# Patient Record
Sex: Male | Born: 1950 | Race: White | Hispanic: No | Marital: Married | Smoking: Former smoker
Health system: Southern US, Community
[De-identification: ages and names within clinical notes are randomized; demographics above are authoritative.]

## PROBLEM LIST (undated history)

## (undated) DIAGNOSIS — I6529 Occlusion and stenosis of unspecified carotid artery: Secondary | ICD-10-CM

## (undated) DIAGNOSIS — I639 Cerebral infarction, unspecified: Secondary | ICD-10-CM

## (undated) DIAGNOSIS — C801 Malignant (primary) neoplasm, unspecified: Secondary | ICD-10-CM

## (undated) DIAGNOSIS — E785 Hyperlipidemia, unspecified: Secondary | ICD-10-CM

## (undated) DIAGNOSIS — C9590 Leukemia, unspecified not having achieved remission: Secondary | ICD-10-CM

## (undated) DIAGNOSIS — K219 Gastro-esophageal reflux disease without esophagitis: Secondary | ICD-10-CM

## (undated) DIAGNOSIS — I739 Peripheral vascular disease, unspecified: Secondary | ICD-10-CM

## (undated) HISTORY — DX: Cerebral infarction, unspecified: I63.9

## (undated) HISTORY — PX: CHOLECYSTECTOMY: SHX55

## (undated) HISTORY — DX: Occlusion and stenosis of unspecified carotid artery: I65.29

## (undated) HISTORY — PX: PR VEIN BYPASS GRAFT,AORTO-FEM-POP: 35551

---

## 2001-09-16 ENCOUNTER — Ambulatory Visit: Admission: RE | Admit: 2001-09-16 | Discharge: 2001-09-16 | Payer: Self-pay | Admitting: Family Medicine

## 2001-09-19 ENCOUNTER — Encounter: Payer: Self-pay | Admitting: *Deleted

## 2001-09-20 ENCOUNTER — Ambulatory Visit (HOSPITAL_COMMUNITY): Admission: RE | Admit: 2001-09-20 | Discharge: 2001-09-20 | Payer: Self-pay | Admitting: *Deleted

## 2001-09-26 ENCOUNTER — Inpatient Hospital Stay (HOSPITAL_COMMUNITY): Admission: RE | Admit: 2001-09-26 | Discharge: 2001-09-28 | Payer: Self-pay | Admitting: *Deleted

## 2001-09-26 ENCOUNTER — Encounter: Payer: Self-pay | Admitting: *Deleted

## 2002-10-19 ENCOUNTER — Encounter: Payer: Self-pay | Admitting: Vascular Surgery

## 2002-10-19 ENCOUNTER — Inpatient Hospital Stay (HOSPITAL_COMMUNITY): Admission: AD | Admit: 2002-10-19 | Discharge: 2002-10-22 | Payer: Self-pay | Admitting: Vascular Surgery

## 2003-02-16 ENCOUNTER — Encounter: Payer: Self-pay | Admitting: Otolaryngology

## 2003-02-16 ENCOUNTER — Encounter: Admission: RE | Admit: 2003-02-16 | Discharge: 2003-02-16 | Payer: Self-pay | Admitting: Otolaryngology

## 2003-02-23 ENCOUNTER — Encounter (INDEPENDENT_AMBULATORY_CARE_PROVIDER_SITE_OTHER): Payer: Self-pay | Admitting: *Deleted

## 2003-02-23 ENCOUNTER — Ambulatory Visit (HOSPITAL_BASED_OUTPATIENT_CLINIC_OR_DEPARTMENT_OTHER): Admission: RE | Admit: 2003-02-23 | Discharge: 2003-02-23 | Payer: Self-pay | Admitting: Otolaryngology

## 2004-07-23 ENCOUNTER — Encounter: Admission: RE | Admit: 2004-07-23 | Discharge: 2004-07-23 | Payer: Self-pay | Admitting: Otolaryngology

## 2004-07-29 ENCOUNTER — Ambulatory Visit (HOSPITAL_BASED_OUTPATIENT_CLINIC_OR_DEPARTMENT_OTHER): Admission: RE | Admit: 2004-07-29 | Discharge: 2004-07-29 | Payer: Self-pay | Admitting: Otolaryngology

## 2004-07-29 ENCOUNTER — Ambulatory Visit (HOSPITAL_COMMUNITY): Admission: RE | Admit: 2004-07-29 | Discharge: 2004-07-29 | Payer: Self-pay | Admitting: Otolaryngology

## 2004-07-29 ENCOUNTER — Encounter (INDEPENDENT_AMBULATORY_CARE_PROVIDER_SITE_OTHER): Payer: Self-pay | Admitting: *Deleted

## 2004-12-10 ENCOUNTER — Encounter: Admission: RE | Admit: 2004-12-10 | Discharge: 2004-12-10 | Payer: Self-pay | Admitting: Otolaryngology

## 2005-02-13 ENCOUNTER — Ambulatory Visit (HOSPITAL_COMMUNITY): Admission: RE | Admit: 2005-02-13 | Discharge: 2005-02-13 | Payer: Self-pay | Admitting: Otolaryngology

## 2005-02-23 ENCOUNTER — Inpatient Hospital Stay (HOSPITAL_COMMUNITY): Admission: RE | Admit: 2005-02-23 | Discharge: 2005-02-25 | Payer: Self-pay | Admitting: Otolaryngology

## 2005-02-23 ENCOUNTER — Encounter (INDEPENDENT_AMBULATORY_CARE_PROVIDER_SITE_OTHER): Payer: Self-pay | Admitting: *Deleted

## 2005-03-05 ENCOUNTER — Ambulatory Visit: Admission: RE | Admit: 2005-03-05 | Discharge: 2005-06-03 | Payer: Self-pay | Admitting: Radiation Oncology

## 2005-03-11 ENCOUNTER — Ambulatory Visit: Payer: Self-pay | Admitting: Oncology

## 2005-03-17 ENCOUNTER — Ambulatory Visit: Payer: Self-pay | Admitting: Dentistry

## 2005-03-17 ENCOUNTER — Encounter: Admission: RE | Admit: 2005-03-17 | Discharge: 2005-03-17 | Payer: Self-pay | Admitting: Dentistry

## 2005-03-30 ENCOUNTER — Ambulatory Visit (HOSPITAL_COMMUNITY): Admission: RE | Admit: 2005-03-30 | Discharge: 2005-03-30 | Payer: Self-pay | Admitting: Radiation Oncology

## 2005-04-30 ENCOUNTER — Ambulatory Visit: Payer: Self-pay | Admitting: Oncology

## 2005-07-22 ENCOUNTER — Ambulatory Visit: Payer: Self-pay | Admitting: Dentistry

## 2005-08-06 ENCOUNTER — Ambulatory Visit: Payer: Self-pay | Admitting: Oncology

## 2005-08-14 ENCOUNTER — Ambulatory Visit (HOSPITAL_COMMUNITY): Admission: RE | Admit: 2005-08-14 | Discharge: 2005-08-14 | Payer: Self-pay | Admitting: Oncology

## 2005-10-06 ENCOUNTER — Ambulatory Visit: Payer: Self-pay | Admitting: Oncology

## 2005-10-06 LAB — COMPREHENSIVE METABOLIC PANEL
AST: 15 U/L (ref 0–37)
Alkaline Phosphatase: 59 U/L (ref 39–117)
BUN: 12 mg/dL (ref 6–23)
Creatinine, Ser: 1.4 mg/dL (ref 0.4–1.5)

## 2005-10-06 LAB — CBC WITH DIFFERENTIAL/PLATELET
EOS%: 3.4 % (ref 0.0–7.0)
HCT: 40 % (ref 38.7–49.9)
MCH: 31.7 pg (ref 28.0–33.4)
MCV: 93.3 fL (ref 81.6–98.0)
MONO%: 9.1 % (ref 0.0–13.0)
NEUT#: 3.5 10*3/uL (ref 1.5–6.5)
NEUT%: 63 % (ref 40.0–75.0)
Platelets: 207 10*3/uL (ref 145–400)
RBC: 4.29 10*6/uL (ref 4.20–5.71)
lymph#: 1.3 10*3/uL (ref 0.9–3.3)

## 2005-12-30 ENCOUNTER — Ambulatory Visit: Payer: Self-pay | Admitting: Oncology

## 2006-01-05 LAB — COMPREHENSIVE METABOLIC PANEL
ALT: <8 U/L (ref 0–40)
AST: 11 U/L (ref 0–37)
Albumin: 3.9 g/dL (ref 3.5–5.2)
Chloride: 100 mEq/L (ref 96–112)
Sodium: 135 mEq/L (ref 135–145)
Total Protein: 6 g/dL (ref 6.0–8.3)

## 2006-01-05 LAB — CBC WITH DIFFERENTIAL/PLATELET
BASO%: 0.4 % (ref 0.0–2.0)
Basophils Absolute: 0 10*3/uL (ref 0.0–0.1)
HGB: 11.2 g/dL — ABNORMAL LOW (ref 13.0–17.1)
LYMPH%: 12.9 % — ABNORMAL LOW (ref 14.0–48.0)
MONO#: 0.7 10*3/uL (ref 0.1–0.9)
NEUT#: 6.9 10*3/uL — ABNORMAL HIGH (ref 1.5–6.5)
Platelets: 229 10*3/uL (ref 145–400)
RDW: 15.8 % — ABNORMAL HIGH (ref 11.2–14.6)
WBC: 9.1 10*3/uL (ref 4.0–10.0)
lymph#: 1.2 10*3/uL (ref 0.9–3.3)

## 2006-06-07 ENCOUNTER — Ambulatory Visit: Payer: Self-pay | Admitting: Oncology

## 2006-06-09 LAB — COMPREHENSIVE METABOLIC PANEL
AST: 12 U/L (ref 0–37)
Albumin: 4.5 g/dL (ref 3.5–5.2)
BUN: 14 mg/dL (ref 6–23)
Chloride: 100 mEq/L (ref 96–112)
Creatinine, Ser: 1.23 mg/dL (ref 0.40–1.50)
Potassium: 4.8 mEq/L (ref 3.5–5.3)
Sodium: 139 mEq/L (ref 135–145)
Total Bilirubin: 0.6 mg/dL (ref 0.3–1.2)

## 2006-06-09 LAB — CBC WITH DIFFERENTIAL/PLATELET
BASO%: 0.4 % (ref 0.0–2.0)
HCT: 38.1 % — ABNORMAL LOW (ref 38.7–49.9)
HGB: 12.9 g/dL — ABNORMAL LOW (ref 13.0–17.1)
LYMPH%: 15.7 % (ref 14.0–48.0)
MCV: 93.2 fL (ref 81.6–98.0)
Platelets: 265 10*3/uL (ref 145–400)
RDW: 14.1 % (ref 11.2–14.6)

## 2006-07-13 ENCOUNTER — Ambulatory Visit (HOSPITAL_COMMUNITY): Admission: RE | Admit: 2006-07-13 | Discharge: 2006-07-13 | Payer: Self-pay | Admitting: Oncology

## 2006-09-25 ENCOUNTER — Inpatient Hospital Stay (HOSPITAL_COMMUNITY): Admission: EM | Admit: 2006-09-25 | Discharge: 2006-09-26 | Payer: Self-pay | Admitting: Emergency Medicine

## 2006-09-25 ENCOUNTER — Encounter (INDEPENDENT_AMBULATORY_CARE_PROVIDER_SITE_OTHER): Payer: Self-pay | Admitting: *Deleted

## 2006-10-12 ENCOUNTER — Ambulatory Visit: Payer: Self-pay | Admitting: Oncology

## 2006-10-15 LAB — COMPREHENSIVE METABOLIC PANEL
AST: 12 U/L (ref 0–37)
BUN: 11 mg/dL (ref 6–23)
Calcium: 9.9 mg/dL (ref 8.4–10.5)
Chloride: 99 mEq/L (ref 96–112)
Creatinine, Ser: 1.34 mg/dL (ref 0.40–1.50)

## 2006-10-15 LAB — CBC WITH DIFFERENTIAL/PLATELET
Basophils Absolute: 0 10*3/uL (ref 0.0–0.1)
EOS%: 3.5 % (ref 0.0–7.0)
HCT: 36.4 % — ABNORMAL LOW (ref 38.7–49.9)
HGB: 12.6 g/dL — ABNORMAL LOW (ref 13.0–17.1)
MCH: 31.5 pg (ref 28.0–33.4)
MCV: 90.5 fL (ref 81.6–98.0)
MONO%: 9.4 % (ref 0.0–13.0)
NEUT%: 65.6 % (ref 40.0–75.0)

## 2006-10-26 ENCOUNTER — Ambulatory Visit: Payer: Self-pay | Admitting: Vascular Surgery

## 2007-03-04 ENCOUNTER — Ambulatory Visit: Payer: Self-pay | Admitting: Oncology

## 2007-03-10 LAB — CBC WITH DIFFERENTIAL/PLATELET
Basophils Absolute: 0 10*3/uL (ref 0.0–0.1)
EOS%: 3.8 % (ref 0.0–7.0)
Eosinophils Absolute: 0.3 10*3/uL (ref 0.0–0.5)
HCT: 39.3 % (ref 38.7–49.9)
HGB: 13.9 g/dL (ref 13.0–17.1)
MCH: 32.2 pg (ref 28.0–33.4)
MONO#: 0.6 10*3/uL (ref 0.1–0.9)
NEUT#: 6.4 10*3/uL (ref 1.5–6.5)
NEUT%: 71.1 % (ref 40.0–75.0)
lymph#: 1.6 10*3/uL (ref 0.9–3.3)

## 2007-03-10 LAB — COMPREHENSIVE METABOLIC PANEL
Albumin: 4.2 g/dL (ref 3.5–5.2)
BUN: 11 mg/dL (ref 6–23)
CO2: 27 mEq/L (ref 19–32)
Calcium: 9.2 mg/dL (ref 8.4–10.5)
Chloride: 99 mEq/L (ref 96–112)
Creatinine, Ser: 1.26 mg/dL (ref 0.40–1.50)
Glucose, Bld: 111 mg/dL — ABNORMAL HIGH (ref 70–99)

## 2007-04-19 ENCOUNTER — Ambulatory Visit: Payer: Self-pay | Admitting: Vascular Surgery

## 2007-07-07 HISTORY — PX: LYMPHADENECTOMY: SHX5960

## 2007-09-05 ENCOUNTER — Ambulatory Visit: Payer: Self-pay | Admitting: Oncology

## 2007-10-25 ENCOUNTER — Ambulatory Visit: Payer: Self-pay | Admitting: Vascular Surgery

## 2008-04-02 ENCOUNTER — Ambulatory Visit: Payer: Self-pay | Admitting: Oncology

## 2008-04-04 LAB — CBC WITH DIFFERENTIAL/PLATELET
BASO%: 0.5 % (ref 0.0–2.0)
Basophils Absolute: 0 10*3/uL (ref 0.0–0.1)
HCT: 44.3 % (ref 38.7–49.9)
LYMPH%: 20.8 % (ref 14.0–48.0)
MONO%: 7 % (ref 0.0–13.0)
NEUT#: 5.7 10*3/uL (ref 1.5–6.5)
NEUT%: 68.1 % (ref 40.0–75.0)
lymph#: 1.8 10*3/uL (ref 0.9–3.3)

## 2008-04-04 LAB — COMPREHENSIVE METABOLIC PANEL
Albumin: 4.4 g/dL (ref 3.5–5.2)
Alkaline Phosphatase: 89 U/L (ref 39–117)
BUN: 12 mg/dL (ref 6–23)
Chloride: 102 mEq/L (ref 96–112)
Creatinine, Ser: 1.58 mg/dL — ABNORMAL HIGH (ref 0.40–1.50)
Potassium: 3.9 mEq/L (ref 3.5–5.3)
Sodium: 137 mEq/L (ref 135–145)
Total Bilirubin: 0.6 mg/dL (ref 0.3–1.2)
Total Protein: 6.9 g/dL (ref 6.0–8.3)

## 2008-04-10 ENCOUNTER — Ambulatory Visit: Payer: Self-pay | Admitting: Vascular Surgery

## 2008-10-16 ENCOUNTER — Ambulatory Visit: Payer: Self-pay | Admitting: Vascular Surgery

## 2009-04-02 ENCOUNTER — Ambulatory Visit: Payer: Self-pay | Admitting: Oncology

## 2009-04-04 LAB — COMPREHENSIVE METABOLIC PANEL
ALT: 18 U/L (ref 0–53)
AST: 15 U/L (ref 0–37)
BUN: 10 mg/dL (ref 6–23)
CO2: 25 mEq/L (ref 19–32)
Chloride: 102 mEq/L (ref 96–112)
Creatinine, Ser: 1.36 mg/dL (ref 0.40–1.50)
Glucose, Bld: 89 mg/dL (ref 70–99)
Potassium: 4 mEq/L (ref 3.5–5.3)
Total Bilirubin: 0.6 mg/dL (ref 0.3–1.2)
Total Protein: 6.8 g/dL (ref 6.0–8.3)

## 2009-04-04 LAB — CBC WITH DIFFERENTIAL/PLATELET
HCT: 42.8 % (ref 38.4–49.9)
HGB: 14.7 g/dL (ref 13.0–17.1)
MCH: 30.3 pg (ref 27.2–33.4)
MCHC: 34.3 g/dL (ref 32.0–36.0)
MCV: 88.2 fL (ref 79.3–98.0)
MONO#: 0.8 10*3/uL (ref 0.1–0.9)
NEUT#: 5.7 10*3/uL (ref 1.5–6.5)
RBC: 4.85 10*6/uL (ref 4.20–5.82)

## 2009-10-31 ENCOUNTER — Ambulatory Visit: Payer: Self-pay | Admitting: Vascular Surgery

## 2010-07-27 ENCOUNTER — Encounter: Payer: Self-pay | Admitting: Otolaryngology

## 2010-11-18 NOTE — Procedures (Signed)
BYPASS GRAFT EVALUATION   INDICATION:  Follow up evaluation of right femoral to popliteal artery  bypass graft.   HISTORY:  Diabetes:  No  Cardiac:  No  Hypertension:  No  Smoking:  Quit in 2004  Previous Surgery:  Tibial vessel thrombectomy and re-do right fem/pop  bypass graft with nonreversed translocated saphenous vein on 10/19/02 by  Dr. Edilia Bo.   SINGLE LEVEL ARTERIAL EXAM                               RIGHT              LEFT  Brachial:                    118                121  Anterior tibial:             108                40  Posterior tibial:            53  Peroneal:                    89                 40  Ankle/brachial index:        0.89               0.33   PREVIOUS ABI:  Date: 10/26/2006  RIGHT:  0.95  LEFT:  0.41    LOWER EXTREMITY BYPASS GRAFT DUPLEX EXAM:   DUPLEX:  Doppler arterial wave forms are triphasic proximal to and then  distal to the right fem-pop bypass graft.   IMPRESSION:  1. Patent right femoral to popliteal artery bypass graft.  2. Ankle-brachial indexes are slightly lower than previously recorded      bilaterally.   ___________________________________________  Di Kindle. Edilia Bo, M.D.   MC/MEDQ  D:  04/19/2007  T:  04/20/2007  Job:  [id number]

## 2010-11-18 NOTE — Procedures (Signed)
BYPASS GRAFT EVALUATION   INDICATION:  Follow-up of lower extremity bypass graft.   HISTORY:  Diabetes:  No.  Cardiac:  No.  Hypertension:  Yes.  Smoking:  No.  Previous Surgery:  Tibial vessel thrombectomy and redo of right fem-pop  bypass graft with nonreversed translocated saphenous vein on 10/19/2002  by Dr. Edilia Bo.   SINGLE LEVEL ARTERIAL EXAM                               RIGHT              LEFT  Brachial:                    164                164  Anterior tibial:             136                858  Posterior tibial:            151  Peroneal:                                       65  Ankle/brachial index:        0.92               0.40   PREVIOUS ABI:  Date: 04/10/2008  RIGHT:  1.07  LEFT:  0.44   LOWER EXTREMITY BYPASS GRAFT DUPLEX EXAM:   DUPLEX:  1. Patent right fem-pop bypass graft with no evidence of focal      stenosis.  2. Biphasic duplex waveforms noted within the graft and native      arteries with increased velocity of 295 cm/s at the inflow artery.   IMPRESSION:  1. Right ABI suggests mild arterial disease.  2. Left ABI suggests severe arterial disease.   ___________________________________________  Di Kindle. Edilia Bo, M.D.   AC/MEDQ  D:  10/16/2008  T:  10/16/2008  Job:  [id number]

## 2010-11-18 NOTE — Procedures (Signed)
BYPASS GRAFT EVALUATION   INDICATION:  Follow up right fem-pop bypass graft.   HISTORY:  Diabetes:  No.  Cardiac:  No.  Hypertension:  No.  Smoking:  No.  Previous Surgery:  Please see above.   SINGLE LEVEL ARTERIAL EXAM                               RIGHT              LEFT  Brachial:                    147                143  Anterior tibial:             130                43  Posterior tibial:            157                65  Peroneal:  Ankle/brachial index:        1.07               0.44   PREVIOUS ABI:  Date: 10/26/07  RIGHT:  0.95  LEFT:  0.34   LOWER EXTREMITY BYPASS GRAFT DUPLEX EXAM:   DUPLEX:  Patent right fem-pop bypass with no evidence of focal stenosis.   IMPRESSION:  1. Patent right femoropopliteal bypass graft with no evidence of focal      stenosis.  2. Normal ankle brachial index with biphasic Doppler waveform noted in      the right leg, status post right femoropopliteal bypass graft.  3. Moderately abnormal ankle brachial index with monophasic Doppler      waveform noted in the left leg.   ___________________________________________  Di Kindle. Edilia Bo, M.D.   MG/MEDQ  D:  04/10/2008  T:  04/10/2008  Job:  [id number]

## 2010-11-18 NOTE — Procedures (Signed)
BYPASS GRAFT EVALUATION   INDICATION:  Follow up right lower extremity bypass graft, stable left  lower extremity claudication.   HISTORY:  Diabetes:  No.  Cardiac:  No.  Hypertension:  Yes.  Smoking:  Previous.  Previous Surgery:  Re-do right femoral-popliteal artery bypass graft and  tibial vessel thrombectomy, 10/19/2002 by Dr. Edilia Bo.   SINGLE LEVEL ARTERIAL EXAM                               RIGHT              LEFT  Brachial:                    116                124  Anterior tibial:             118                57  Posterior tibial:            129                58  Peroneal:  Ankle/brachial index:        1.04               0.47   PREVIOUS ABI:  Date: 10/16/2008  RIGHT:  0.92  LEFT:  0.40   LOWER EXTREMITY BYPASS GRAFT DUPLEX EXAM:   DUPLEX:  Doppler arterial waveforms appear biphasic proximal to, within,  and distal to the right lower extremity bypass graft.   IMPRESSION:  1. Patent right femoral-popliteal artery bypass graft.  2. Bilateral ankle brachial indices appear stable from previous      studies.  3. Right ankle brachial indices within normal limits.  4. Left ankle brachial index is suggestive of moderate/severe arterial      compromise.  5. No significant changes from previous study.   ___________________________________________  Di Kindle. Edilia Bo, M.D.   AS/MEDQ  D:  10/31/2009  T:  10/31/2009  Job:  [id number]

## 2010-11-21 NOTE — Op Note (Signed)
[place]. [facility]  Patient:    [name], [name] Visit Number: [id number] MRN: [id number]          Service Type: SUR Location: 2000 2018 01 Attending Physician:  Caralee Ates Dictated by:   Caralee Ates, M.D. Proc. Date: 09/26/01 Admit Date:  09/26/2001                             Operative Report  PREOPERATIVE DIAGNOSIS:  Right lower extremity ischemia.  POSTOPERATIVE DIAGNOSIS:  Right lower extremity ischemia.  OPERATION PERFORMED:  Right femoral to above-knee artery popliteal bypass (8 mm PTFE Inter-ring).  SURGEON:  Caralee Ates, M.D.  ASSISTANT:  Adair Patter, P.A.  ANESTHESIA:  General endotracheal.  ESTIMATED BLOOD LOSS:  50 cc.  DRAINS:  None.  SPECIMENS:  None.  COMPLICATIONS:  None.  INDICATIONS FOR PROCEDURE:  The patient is a 60 year old white male who has had progressive debilitating pain in both lower extremities for approximately six months.  His current pain-free interval is limited to just a few steps and he is unable to participate in his job activities.  He underwent arteriogram which demonstrated superficial femoral artery occlusion with two-vessel runoff on the right via peroneal and anterior tibial artery.  He was felt to be a good candidate for reconstruction with above-knee femoropopliteal artery.  DESCRIPTION OF PROCEDURE:  The patient was brought to the operating room and placed on the operating table in supine position.  Following adequate general endotracheal anesthesia, the right lower extremity was prepped and draped circumferentially from the groin to the foot.  A vertical incision was made in the right groin and deepened using the Bovie to control bleeding. The common femoral artery was identified at this level and encircled proximally with a vessel loop.  The distal common femoral artery was exposed down to its bifurcation and the profunda femoris and superficial femoral arteries were also encircled with  vessel loops individually.  At this point the wound was then packed with thrombin saline soaked gauze sponge.  Next, a longitudinal incision was made over the medial aspect of the right thigh just above the knee.  The wound was deepened using the Bovie to control bleeding.  The sartorius muscle was reflected inferiorly and the popliteal space was entered above the knee.  The superficial femoral artery at its junction with the popliteal artery was identified at this level and encircled proximally and distally with vessel loops.  Next an 8 mm Inter-ring PTFE graft was then tunneled in the anatomic plane deep to the sartorius muscle and brought out in both wounds.  The patient was then systemically heparinized.  Following an adequate three-minute circulation time, the common femoral, superficial femoral and profunda femoris were occluded.  A longitudinal arteriotomy was performed.  The PTFE graft was then spatulated and sewn into position in end-to-side fashion with running 6-0 PTFE suture.  Following completion of anastomosis, a clamp was placed on the graft and the loops around the superficial femoral, profunda and common femoral artery were released.  There was no bleeding from the anastomotic suture line.  Next, the loops around the popliteal artery were tightened to occlude flow.  A longitudinal arteriotomy was performed.  The PTFE graft was extended to the appropriate length, spatulated and sewn into position with running 6-0 PTFE suture.  Prior to completion anastomosis, the native vessels were flushed and the graft was flushed as well.  The anastomosis was  then completed and the clamps were released restoring flow.  There was excellent flow in the anterior tibial and peroneal arteries which was not easily heard preoperatively.  This flow was graft dependent.  At completion, arteriogram was performed which demonstrated a good flow in the popliteal and peroneal and anterior tibial  arteries.  Next, 30 mg of protamine was administered and hemostasis was achieved in the wounds. The wounds were then irrigated with warm sterile saline and closed in layers of 2, 3, and 4-0 Vicryl suture.  Sterile dry dressings were applied and the patient was then awakened from anesthesia and transferred to the recovery room in stable condition.  The patient tolerated the procedure well.  There were no complications.  All sponge and needle counts were reported as correct.Dictated y:   Caralee Ates, M.D. Attending Physician:  Caralee Ates. DD:  09/26/01 TD:  09/27/01 Job: [id number]

## 2010-11-21 NOTE — Op Note (Signed)
NAME:  [name] [name], [name] [name] NO.:  [id number]   MEDICAL RECORD NO.:  [id number]                   PATIENT TYPE:  OIB   LOCATION:  2871                                 FACILITY:  [facility]   PHYSICIAN:  Di Kindle. Edilia Bo, M.D.        DATE OF BIRTH:  [birth year]   DATE OF PROCEDURE:  10/19/2002  DATE OF DISCHARGE:                                 OPERATIVE REPORT   PREOPERATIVE DIAGNOSIS:  Ischemic right lower extremity with occluded right  femoral-popliteal bypass graft.   POSTOPERATIVE DIAGNOSIS:  Ischemic right lower extremity with occluded  femoral-popliteal bypass graft.   PROCEDURES:  1. Redo right femoral to above-knee popliteal artery bypass graft with a     nonreversed, translocated saphenous vein graft.  2. Tibial thrombectomy.  3. Intraoperative arteriogram.   SURGEON:  Di Kindle. Edilia Bo, M.D.   ASSISTANTS:  Toribio Harbour, R.N., and Loura Pardon, P.A.   ANESTHESIA:  General.   INDICATIONS:  This is a 60 year old gentleman who had undergone a previous  right femoral to above-knee popliteal artery bypass graft with an 8 mm PTFE  graft by Caralee Ates, M.D., in March 2003.  He had been doing well since  then with no significant claudication in the right lower extremity until  approximately 10:30 reportedly this morning, when he developed the acute  onset of severe, disabling pain in the right lower extremity.  He was seen  in our office and found to have no Doppler flow in the right foot with a  pale, ischemic, cold right foot.  He had decreased sensation and was taken  to the hospital urgently for revascularization.  On examination the patient  had no Doppler flow in the foot with a cool, pale right foot.  There was  decreased motor and sensory function.   DESCRIPTION OF PROCEDURE:  The patient was taken to the operating room and  received a general anesthetic.  The entire right lower extremity was prepped  and draped in the usual  sterile fashion.  The incision in the right groin  was opened into dense scar tissue.  The common femoral, superficial femoral,  and deep femoral artery, and the femoral-popliteal bypass graft were  dissected free and controlled with vessel loops.  There was a reasonable  pulse at the femoral level with some plaque in the posterior and medial wall  of the common femoral artery.  Next as this graft had only been in for a  year, I felt that it might be worth redoing the bypass with a vein graft if  the vein was of adequate size.  I therefore explored the saphenofemoral  junction and followed the saphenous vein down to the knee using three  additional incisions along the medial aspect of the right lower extremity.  The vein was felt to be of adequate size and branches were divided between  clips and 3-0 silk ties.  Through  the incision in the distal thigh which had  previously been used for the exposure, the above-knee popliteal artery and  the distal PTFE graft were exposed.  The above-knee popliteal artery did  have some plaque in it but was patent.  Next a tunnel was created between  the above-knee popliteal artery incision and the groin incision.  The  saphenofemoral junction was then clamped and then the saphenous vein removed  from the femoral vein, and the femoral vein was oversewn with a 5-0 Prolene  suture.  The proximal valve in the vein was sharply excised.  The vein was  then spatulated to be used in a nonreversed fashion.  It had been distended  up with heparinized saline.  Next the common femoral, superficial femoral,  and deep femoral arteries were controlled after the patient was heparinized.  The old graft was divided and the entire proximal segment of the graft was  removed from the femoral artery.  I extended this arteriotomy further  proximally and then the widely spatulated saphenous vein was sewn end-to-  side to the common femoral artery using continuous 6-0 Prolene  suture.  Prior to completing the closure, the vessels were backbled and flushed  appropriately and the anastomosis completed.  Next the valves in the vein  were lysed using a retrograde Mills valvulotome and excellent flow  established to the graft.  The graft was then flushed with heparinized  saline and clamped.  It was then marked to prevent twisting.  It was then  brought through the previously-created tunnel.  Next the distal graft was  divided, and this entire distal segment of the graft was removed from the  above-knee popliteal artery.  There was significant clot from the popliteal  artery, and that extended down into the tibial vessels.  Therefore, I did a  tibial thrombectomy using a #3 and #4 Fogarty catheter and did retrieve a  large amount of clot.  The patient most likely had occluded that femoral-  popliteal graft and then embolized to the tibial vessels.  This explained  the profound ischemia on presentation.  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  Next the vein graft was cut to the appropriate  length, spatulated, and sewn end-to-side to the popliteal artery.  After  this the arteriotomy had been extended significantly lower on the popliteal  artery above the knee.  Prior to completing the anastomosis, the vessels  were backbled and flushed appropriately and the anastomosis completed.  Flow  was re-established to the right leg and then the foot did pink up.  Intraoperative arteriogram was obtained, which showed no technical problems  and two-vessel runoff via the anterior tibial and peroneal arteries, which  had moderate diffuse disease.  There was an anterior tibial and peroneal  signal at the completion of the procedure.  Hemostasis was obtained in the wounds.  Two 15 Blake drains were placed.  The wounds  were closed with a  deep layer of 3-0 Vicryl and the skin closed with staples.  A sterile  dressing was applied.  The patient tolerated the procedure well and was  transferred to the recovery room in satisfactory condition.  All needle and  sponge counts were correct.  Di Kindle. Edilia Bo, M.D.    CSD/MEDQ  D:  10/19/2002  T:  10/20/2002  Job:  [id number]

## 2010-11-21 NOTE — Consult Note (Signed)
[place]. [facility]  Patient:    [name] [name], [name] [name] Visit Number: [id number] MRN: [id number]          Service Type: DSU Location: 2000 2019 01 Attending Physician:  Caralee Ates Dictated by:   Caralee Ates, M.D. Proc. Date: 09/20/01 Admit Date:  09/20/2001 Discharge Date: 09/20/2001   CC:         CVTS Office   Consultation Report  PREOPERATIVE DIAGNOSIS:  Bilateral lower extremity ischemia.  POSTOPERATIVE DIAGNOSIS:  Bilateral lower extremities ischemia.  PROCEDURES: 1. Aortogram with bilateral lower extremity runoff. 2. Selective right lower extremity angiogram. 3. Selective left lower extremity angiogram (2nd order).  SURGEON:  Caralee Ates, M.D.  ACCESS:  Right common femoral artery (#5-French sheath).  TOTAL CONTRAST:  200 cc of Visipaque.  TOTAL FLUOROSCOPY TIME:  6 minutes, 10 seconds.  COMPLICATIONS:  None.  BRIEF HISTORY:  This is a 60 year old white gentleman with a long history of heavy cigarette smoking who has presented to his primary care physician with complaints of bilateral lower extremity calf and foot pain with very short distance claudication (less than 1 block) as well as some superficial skin necrosis on the lateral aspects of both feet. She was found to have extremely poor Doppler flow in both feet with ankle brachial indices that could not be calculated.  He was felt to need peripheral reconstruction for limb salvage due to the fact that they already had early tissue loss in both feet. He was scheduled for arteriogram in preparation for revascularization.  DESCRIPTION OF PROCEDURE:  The patient was brought to the cath lab and placed on the cath lab table in supine position. Following adequate IV sedation, the groins were prepped and draped in a sterile fashion. The right femoral head was localized under fluoroscopy and the overlying skin was anesthetized with 1% lidocaine. A small nick incision was made at this level,  and a tract was developed bluntly down to the level of the right common femoral artery. The right common femoral artery was percutaneously punctured. A .035 guidewire was advanced into the infrarenal aorta without difficulty. A #5-French pigtail cath was advanced over the guidewire in position in the infrarenal aorta and aortogram was performed. Once satisfactory images had been obtained, the runoff images of both lower extremities were performed. Peek hole images of the right leg below the level of the knee and the lateral view of the foot were performed. Next, the IMA catheter was used to cannulate the left iliac artery across the bifurcation of the aorta, and the guidewire was advanced on through the left iliac system. An end-hole catheter was then exchanged for the IMA catheter and advanced into the external iliac. The selective images of the left lower extremity below the level of the knee were performed in multiple oblique positions to clearly delineate the anatomy. Once satisfactory images were obtained, the catheter was then removed over guidewire. With completion of the procedure, the sheath in the right common femoral artery was then removed and direct pressure was held until hemostasis was achieved. The patient was then transferred to the recovery area in stable condition. The patient tolerated the procedure well.  ANGIOGRAPHIC FINDINGS:  Normal caliber in infrarenal aorta. There is a single right renal artery, impaired left renal arteries, all of which appear to be normal in caliber without evidence of stenosis. The common iliac arteries   on both sides are widely patent. There is only minimal atherosclerotic change  in the right  common iliac artery. The external iliac arteries are widely patent. Common femoral arteries are both patent bilaterally. On the right side, the superficial femoral artery is secluded at its origin. The profundus femoris artery is patent with  well-developed branches into the thigh. The superficial femoral artery reconstitutes at the proximal thigh and is patent throughout the remainder of its course. There is a high-grade (greater than 60% stenosis) of the distal SFA, proximal to Hunters canal. Distal to this, the SFA and the popliteal artery are widely patent. There is 2-vessel runoff to the foot via the anterior tibial and peroneal artery. On the left side, the superficial artery is occluded at its origin and reconstitutes through a profunda collateral and then occludes for a long segment in the mid thigh. The profundus femoris artery is widely patent with well-developed branches into the thigh. The SFA reconstitutes in the distal and is patent throughout the remainder of its course. The popliteal artery is widely patent, however, there is an area of approximately 40% stenosis just about the knee. The remainder of the popliteal is widely patent. The runoff to the foot is via the anterior tibial and peroneal artery on this side, however, there is a proximal stenosis at the origin of the anterior tibial artery and the peroneal artery appears to branch in multiple small collaterals just about the ankle. The anterior tibial artery appears to be patent onto the foot, however, the circulation time is slow that images into the foot could not be  obtained.  RECOMMENDATIONS: 1. Routine post-catheterization care. 2. Will recommend femoral above knee artery popliteal bypass to    the right lower extremity and femoral anterior tibial artery    bypass to the left lower extremity, both of which were limb salvage.    Will obtain preoperative vein mapping and cardiac risk stratification    in preparation for revascularization. Dictated by:   Caralee Ates, M.D. Attending Physician:  Caralee Ates. DD:  09/20/01 TD:  09/21/01 Job: [id number]

## 2010-11-21 NOTE — Procedures (Signed)
BYPASS GRAFT EVALUATION   INDICATION:  Followup, right lower extremity bypass graft.  Patient with  no new complaints since last visit on 04/19/07.   HISTORY:  Diabetes:  No.  Cardiac:  No.  Hypertension:  No, just taken off medication.  Smoking:  No.  Previous Surgery:  Tibial vessel thrombectomy and redo of right fem-pop  BPG with nonreversed translocated saphenous vein on 10/19/02 by Dr.  Edilia Bo.   SINGLE LEVEL ARTERIAL EXAM                               RIGHT              LEFT  Brachial:                    130                130  Anterior tibial:             120                44  Posterior tibial:            123  Peroneal:  Ankle/brachial index:        0.95               0.34   PREVIOUS ABI:  Date: 04/19/07  RIGHT:  0.89  LEFT:  0.33   LOWER EXTREMITY BYPASS GRAFT DUPLEX EXAM:   DUPLEX:  1. Patent right CFA inflow with increased velocity noted.  2. Patent right fem-pop BPG with velocities ranging from 66 cm/s - 101      cm/s without evidence of stenosis.  3. Patent distal native artery.   IMPRESSION:  1. Mild increase in right ankle brachial index.  2. Stable left ankle brachial index.  3. Patent right common femoral artery inflow with increased velocity      noted.  4. Patent right femoral-popliteal bypass graft without evidence of      stenosis.  5. Patent distal native artery.   ___________________________________________  Di Kindle. Edilia Bo, M.D.   PB/MEDQ  D:  10/26/2007  T:  10/26/2007  Job:  [id number]

## 2010-11-21 NOTE — Op Note (Signed)
NAME:  THIERNO, HUN NO.:  [id number]   MEDICAL RECORD NO.:  [id number]          PATIENT TYPE:  INP   LOCATION:  1612                         FACILITY:  [facility]   PHYSICIAN:  Sandria Bales. Ezzard Standing, M.D.  DATE OF BIRTH:  Nov 21, 1950   DATE OF PROCEDURE:  09/25/2006  DATE OF DISCHARGE:                               OPERATIVE REPORT   PREOPERATIVE DIAGNOSIS:  Acute cholecystitis with cholelithiasis.   POSTOPERATIVE DIAGNOSIS:  Acute and chronic cholecystitis with  cholelithiasis and a ruptured gallbladder.   PROCEDURE:  Laparoscopic cholecystectomy with intraoperative  cholangiogram.   SURGEON:  Sandria Bales. Ezzard Standing, M.D.   ASSISTANT:  Angelia Mould. Derrell Lolling, M.D.   ANESTHESIA:  General endotracheal anesthesia.   ESTIMATED BLOOD LOSS:  75 mL.   DRAINS:  19 Blake drain.   INDICATIONS FOR PROCEDURE:  Mr. Knotts is a 60 year old white male who has  had about a four day history of abdominal pain, was admitted last  night/early this morning with a CT scan and ultrasound showing evidence  of acute cholecystitis and a white blood count over 20,000.  I discussed with him the indications and potential complications of  gallbladder surgery, the potential complications include but are not  limited to bleeding, infection, bile duct injury, and the possibility of  open surgery.   OPERATIVE NOTE:  The patient was placed in a supine position and given a  general endotracheal anesthesia.  He was on Ancef as an antibiotic.  He  had PAS stockings in place.  A time out was held identifying the patient  and procedure.  The abdomen was prepped with Betadine solution and  sterilely draped.  An infraumbilical incision was made with sharp  dissection and carried down to the abdominal cavity.  A 0 degree 10 mm  laparoscope was inserted through a 12 mm Hassan trocar and the [facility]  trocar was secured with a 0 Vicryl suture.  Four additional trocars were  placed.  There was a 10 mm subxiphoid  location, a 5 mm right mid  subcostal, a 5 mm lateral subcostal, and a 5 mm midline as a fifth  trocar.  The gallbladder was noted to be acutely inflamed, there was  bile spillage and locally, the gallbladder ruptured along the medial  wall.  Dissection was carried down, dissecting omentum off the  gallbladder.  I got down to where I identified as a cystic duct  gallbladder junction.  I then shot an intraoperative cholangiogram.   The intraoperative cholangiogram was shot using a cut off taut catheter  inserted through a 14 gauge Jelco catheter, inserted into the side of  the cut cystic duct and secured with an endoclip.  Half strength Hypaque  solution was injected showing free flow of contrast down the cystic duct  into the duodenum and up the hepatic radicals.  There was no bile leak  and it looked like he actually had a little bit longer cystic duct than  what I imagined at the time of surgery.  What had happened where this  gallbladder wall ruptured medially was right over  the portal triad and  so after I was able to free up the cystic duct, I was able to identify  or expose this better.   So, I removed my taut catheter.  I triply endoclipped the cystic duct.  I then developed the triangle of Calot, again which was distorted by  this medial rupture of the gallbladder.  The gallbladder was then  sharply and bluntly dissected from the gallbladder bed.  I did spill  stones which I retrieved as I found them.  This medial part of the wall  which was ruptured, basically there was no way to get this wall off, so  I left part of the wall along the back of the gallbladder.  After  excising the entire gallbladder, I placed it in an endocatch bag, I  burned the back of the wall for any remaining gallbladder tissue or  lining, also controlling bleeding.  After irrigating with about 3.5  liters of saline, I reinspected the liver wall and the triangle of  Calot.  There was no bleeding or bile  leak.  The gallbladder was then  placed in an endocatch bag.  I brought a 52 [place] Blake drain through  the lateral lower trocar and sewed this in place with a 2-0 nylon  suture.   The gallbladder was delivered.  The abdomen was inspected, again, there  was no bleeding and no bile leak.  Each trocar was removed in turn.  The  umbilical port was closed with two sutures of 2-0 Vicryl suture, the  skin was closed with a 5-0 Vicryl suture, painted with tincture of  Benzoin, and covered with Steri-Strips.   The patient tolerated the procedure well and was transported to the  recovery room in good condition.  Sponge and needle counts were correct  at the end of the case.      Sandria Bales. Ezzard Standing, M.D.  Electronically Signed     DHN/MEDQ  D:  09/25/2006  T:  09/25/2006  Job:  [id number]   cc:   Holley Bouche, M.D.  Fax: [phone]   Kristine Garbe. Ezzard Standing, M.D.  Fax: [phone]   Di Kindle. Edilia Bo, M.D.  [address]  [place]  [place] [address]

## 2010-11-21 NOTE — H&P (Signed)
NAME:  [name] [name], [name] [name] NO.:  [id number]   MEDICAL RECORD NO.:  [id number]                   PATIENT TYPE:  OIB   LOCATION:  2871                                 FACILITY:  [facility]   PHYSICIAN:  Di Kindle. Edilia [name], M.D.        DATE OF BIRTH:  17-Jul-1950   DATE OF ADMISSION:  10/19/2002  DATE OF DISCHARGE:                                HISTORY & PHYSICAL   PRIMARY CARE PHYSICIAN:  Holley Bouche, M.D.   CHIEF COMPLAINT:  Pain, right foot.   HISTORY OF PRESENT ILLNESS:  The patient is a 60 year old Caucasian man who  presented to the CVTS office today with a complaint of sudden onset of pain  in his right calf and foot at approximately 10:30 a.m.  He is status post a  right femoral above the knee popliteal bypass by Caralee Ates, M.D., in  March of 2003.  Follow-up lower extremity duplex exam on September 29, 2002,  revealed a stable bypass graft with ABIs noted to be 0.85 on the right and  0.36 on the left.  Today's lower duplex exam revealed a failing right  femoropopliteal bypass graft.  ABI on the right was inaudible and on the  left it was 0.32.  Dr. Di Kindle. Dickson's recommendation was to  undergo thrombectomy and possible revision of his femoropopliteal bypass  graft today.   PAST MEDICAL HISTORY:  1. Peripheral vascular occlusive disease.  He had an angiogram on September 20, 2001, which revealed bilateral femoropopliteal occlusive disease.  He is     status post a right femoral to above the knee popliteal bypass with PTFE     graft on September 26, 2001, by Caralee Ates, M.D.  2. Hypertension.   He specifically denies any history of heart disease, diabetes, and kidney  disease.   ALLERGIES:  The patient has no known drug allergies.   MEDICATIONS PRIOR TO ADMISSION:  1. Bisoprolol/HCTZ 2.5/6.25 mg daily.  2. Aspirin 81 mg p.o. daily.   SOCIAL HISTORY:  He is married.  He and his wife have four adult children  together.  He is  employed as an Personnel officer.  He continues to use tobacco at  a rate of one pack a day and he has done this for the last 40-41 years.  He  has stopped drinking alcohol since last March.  He lives with his wife in a  single-level dwelling.  He drives, as does his wife, and she will be  available for assistance after discharge from the hospital.   FAMILY HISTORY:  Noncontributory.   REVIEW OF SYSTEMS:  In general, [name] [name] ______ is very good.  He reports no  difficulty chewing or swallowing.  No changes in his vision or hearing of  late.  He does report some dyspnea on exertion, particularly after walking  stairs.  He does have a mild cough.  He  has no chest pain.  No GI  complaints.  No GU complaints.  He ambulates independently, however, he does  have claudication, left worse than the right.  This occurs within one city  block of walking.   PHYSICAL EXAMINATION:  VITAL SIGNS:  Blood pressure 155/104, heart rate 78,  temperature 97.1 degrees.  HEIGHT:  5 feet 6 inches.  WEIGHT:  176 pounds.  GENERAL APPEARANCE:  This is a 60 year old Caucasian man in no acute  distress, lying on a hospital stretcher in preparation for surgery.  HEENT:  His oral mucosa is pink and moist.  He is edentulous.  NECK:  Full range of motion.  No carotid bruits are appreciated.  No  thyromegaly or lymphadenopathy is noted.  LUNGS:  His breathing is unlabored.  His breath sounds on the left are with  rhonchi and clear on the right.  HEART:  Regular rate and rhythm.  ABDOMEN:  Rotund.  He does have positive bowel sounds.  His abdomen is soft  and nontender.  EXTREMITIES:  The right lower leg and foot are cool.  His toes are white.  He is able to move and feel sensation.  His left foot is pink and warm.  He  has 1+ femoral pulses bilaterally.  His popliteal and pedal pulses are not  palpable bilaterally.  NEUROLOGIC:  He is alert and oriented.  Cranial nerves II-XII are grossly  intact.  His gait was not  assessed.   LABORATORY DATA:  The CBC revealed white blood cell count 13.2, hemoglobin  15.5, hematocrit 45.2, and platelets 220.  Chemistries include sodium 133,  potassium 3.8, chloride 101, CO2 25, BUN 6, creatinine 0.8, and glucose 99.  PT 13.1, INR 0.9.  The admission EKG is in normal sinus rhythm at 72 beats  per minute.   IMPRESSION:  This is a 60 year old Caucasian man with acute failure of his  right above the knee femoropopliteal bypass graft.   PLAN:  The plan per Di Kindle. Edilia [name], M.D., is to admit to [facility].  [facility] under the care of Dr. Edilia [name] for urgent thrombectomy  and possible revision of his right above the knee popliteal bypass graft.  He will also be counseled again on smoking cessation during this  hospitalization.     Toribio Harbour, R.N.                  Di Kindle. Edilia [name], M.D.    CTK/MEDQ  D:  10/19/2002  T:  10/19/2002  Job:  [id number]

## 2010-11-21 NOTE — Op Note (Signed)
[place]  Patient:    [name] [name], [name] [name] Visit Number: [id number] MRN: [id number]          Service Type: SUR Location: [id number] Attending Physician:  Caralee Ates. Dictated by:   [facility].[facility] Proc. Date: 09/26/01 Admit Date:  09/26/2001                             Operative Report  NO DICTATION Dictated by:   [id number] Attending Physician:  Caralee Ates. DD:  09/26/01 TD:  09/27/01 Job: [id number]

## 2010-11-21 NOTE — Op Note (Signed)
NAME:  [name] [name], [name] [name] NO.:  [id number]   MEDICAL RECORD NO.:  [id number]          PATIENT TYPE:  INP   LOCATION:  2550                         FACILITY:  [facility]   PHYSICIAN:  Kristine Garbe. 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   OPERATION PERFORMED:  Direct laryngoscopy.  Right radical neck dissection.   SURGEON:  Kristine Garbe. Ezzard Standing, M.D.   ANESTHESIA:  General endotracheal.   COMPLICATIONS:  None.   ASSISTANT:  Hermelinda Medicus, M.D.   INDICATIONS FOR PROCEDURE:  [name] [name] is a 60 year old gentleman who is  seven months status post excision of a T1 right palatal squamous cell  carcinoma in January.  He had a follow-up CT scan in June which was negative  for any adenopathy in the neck.  However, on follow-up exam in August, he  developed a nodule in the right upper neck and PET scan was consistent with  probable carcinoma right superior jugular neck node.  He was taken to the  operating room time for a right radical neck dissection and direct  laryngoscopy.   DESCRIPTION OF PROCEDURE:  After adequate endotracheal anesthesia, the  patient received 1 g Ancef IV preoperatively.  First direct laryngoscopy was  performed.  The area of excision of the palatal carcinoma is well healed. It  was soft to palpation with no induration and no mucosal abnormalities noted.  Base of tongue and vallecula were clear. Epiglottis was normal.  Vocal cords  were clear.  Piriform sinuses were clear.  This completed direct  laryngoscopy.  Next, the patient was turned.  Right neck was prepped with  Betadine solution and draped out with sterile towels.  The patient had a  palpable node in the superior  jugular node.  A standard neck incision was  made from the mastoid tip down toward the laryngeal cartilage with a  vertical limb down over the supraclavicular area.  The subplatysmal flaps  were elevated superiorly and inferiorly.  First the 11th cranial nerve or  accessory nerve was identified just below the sternocleidomastoid muscle  posteriorly and this was preserved throughout the dissection. Next,  dissection was carried out inferiorly where the supraclavicular fat pad and  lymph nodes were identified.  There were some anthracotic or dark nodes in  the supraclavicular area that were removed with the neck specimen.  The  jugular vein was identified, was ligated with 2-0 silk suture and 2-0 silk  suture ligatures and divided.  The vagus nerve and phrenic nerve were  identified and preserved.  The carotid artery was identified and dissection  was carried up along the carotid artery.  In the submandibular area, the  submandibular gland was removed and excised and sent with the neck  dissection but as a separate specimen.  There were no palpable submandibular  nodes.  Dissection was carried along the carotid artery superiorly.  At the  superior aspect of the carotid artery, the 12th cranial nerve was identified  and preserved and the 11th nerve at it ran up next to the jugular vein was  identified and preserved.  The jugular vein was then dissected out  superiorly and ligated with 2-0 silk suture ligature and divided.  The large  nodes were in the superior portion of the jugular chain but on dissection,  the nodes were not attached to any of the neurovascular bundle and came up  easily off of the carotid artery.  We were well above the superior aspect of  the enlarged nodes when we divided the jugular vein superiorly.  The  remaining fascia and lymphatics were dissected off of the deep cervical  fascia and the neck dissection was removed and sent as a specimen with a  single large long  silk suture identifying the superior aspect of the neck  dissection.  Hemostasis was obtained with 2-0 silk ligatures along with a  couple of 4-0 silk ligatures.  Along with cautery.  The neck was irrigated  with saline.  Of note there were some chyle leak inferiorly at the inferior  aspect of the dissection of the jugular vein and several 4-0 silk ligatures  were placed in this area where the chyle leak appeared to be extruding.  After obtaining control of hemostasis and chyle leak, the wound was  irrigated again with saline.  Specimen was sent off and the neck dissection  was closed with 3-0 chromic sutures subcutaneously to reapproximate the  platysma muscle, staples on the skin.  A large Hemovac drain was brought out  through a separate stab incision inferiorly and secured to the skin with 2-0  silk ligature.  This completed the procedure.  [name] [name] was awakened from  anesthesia and transferred to the recovery room doing well.   DISPOSITION:  [name] [name] will be observed, will be admitted to the hospital for  two to three days for recovery .           ______________________________  Kristine Garbe Ezzard Standing, M.D.     CEN/MEDQ  D:  02/23/2005  T:  02/23/2005  Job:  [id number]   cc:   Holley Bouche, M.D.  [address]  [place], [place] [address]  Fax: [phone]   Hermelinda Medicus, M.D.  [address]  [place] [address]  Fax: [phone]

## 2010-11-21 NOTE — Op Note (Signed)
NAME:  [name] [name], NANNINI NO.:  [id number]   MEDICAL RECORD NO.:  [id number]          PATIENT TYPE:  AMB   LOCATION:  DSC                          FACILITY:  [facility]   PHYSICIAN:  Christopher E. Ezzard Standing, M.D.DATE OF BIRTH:  Mar 31, 1951   DATE OF PROCEDURE:  07/29/2004  DATE OF DISCHARGE:                                 OPERATIVE REPORT   PREOPERATIVE DIAGNOSES:  1.  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  2.  Right anterior vocal cord nodule lesion, questionable neoplasia.   POSTOPERATIVE DIAGNOSES:  1.  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  2.  Right anterior vocal cord nodule lesion, questionable neoplasia.   OPERATION:  1.  Wide excision of right palate, tonsil squamous cell carcinoma.  2.  Microlaryngoscopy with excision of right anterior vocal cord nodule.   SURGEON:  Kristine Garbe. Ezzard Standing, M.D.   ANESTHESIA:  General endotracheal.   COMPLICATIONS:  None.   BRIEF CLINICAL NOTE:  [name] [name] is a 60 year old gentleman who has had a  sore throat on the right side for the past couple of months.  On exam in the  office, he had an approximately 2 cm palatal lesion that was biopsied in the  office and positive for squamous cell carcinoma.  He underwent a CT scan,  which showed no adenopathy in the neck and no deep penetration or obvious  lesion noted on CT scan.  Of note, also in the office on fiberoptic  laryngoscopy, [name] [name] had a nodule on the anterior right true vocal cord, and  we will plan excision of this at the same time as the wide excision of the  palatal lesion.   DESCRIPTION OF PROCEDURE:  After adequate endotracheal anesthesia, [name] [name]  received 10 mg Decadron IV preoperatively as well as 1 g Ancef IV  preoperatively.  Photos were obtained of the vocal cord lesion as well as  the palatal lesion.  First microlaryngoscopy was performed.  On  laryngoscopy, the base of tongue, vallecular, and epiglottis were all  normal.   Both piriform sinuses were clear.  Next the endolarynx was  evaluated.  The false cords were clear, the laryngeal surface of the  epiglottis was clear.  The vocal cords were then visualized.  There was a  nodule on the anterior right true vocal cord.  The laryngoscope was  suspended and the nodule was removed on the anterior right true vocal cord  and sent to pathology.  On frozen section report, this revealed a benign  nodule.  Nothing else was done to the right vocal cord after removing the  nodule.  Next a mouth gag was used to expose the oropharynx.  The patient  had an approximate 2 cm nodular lesion on the right soft palate just  superior to the right tonsil.  The palatal lesion was removed with  approximately 1 cm normal-appearing mucosal margin, and this excision  included the right tonsil.  Hemostasis was obtained with the cautery.  This  completed the procedure.  The specimen was sent to pathology  with a suture  marking the superior portion of the palate at the 12 o'clock position.  [name] [name]  was awoken from anesthesia and transferred to the recovery room postop doing  well.   DISPOSITION:  He was instructed on voice rest for the next week and was  given Tylenol and Lortab Elixir 15-30 mL q.4h. p.r.n. pain, amoxicillin  suspension 600 mg b.i.d. for one week.  We will have him follow up in my  office for recheck and review pathology.      CEN/MEDQ  D:  07/29/2004  T:  07/29/2004  Job:  [id number]   cc:   Holley Bouche, M.D.  [address]  [place], [place] [address]  Fax: [phone]

## 2010-11-21 NOTE — Op Note (Signed)
NAME:  [name] [name], [name] [name]                            ACCOUNT NO.:  [id number]   MEDICAL RECORD NO.:  [id number]                   PATIENT TYPE:  AMB   LOCATION:  DSC                                  FACILITY:  [facility]   PHYSICIAN:  Kristine Garbe. Ezzard Standing, M.D.         DATE OF BIRTH:  03-Dec-1950   DATE OF PROCEDURE:  02/23/2003  DATE OF DISCHARGE:                                 OPERATIVE REPORT   PREOPERATIVE DIAGNOSIS:  Right nasal mass with epistaxis.   POSTOPERATIVE DIAGNOSIS:  Right nasal mass with epistaxis.   OPERATION:  Endoscopic sinus surgery with removal of right nasal mass.   SURGEON:  Kristine Garbe. Ezzard Standing, M.D.   ANESTHESIA:  General.   COMPLICATIONS:  None.   BRIEF CLINICAL NOTE:  [name] is a 60 year old gentleman who has had  recurrent right-sided epistaxis as well as nasal obstruction.  On  examination he has a large mass obstructing the right nasal cavity.  He is  taken to the operating room at this time for endoscopic removal of right  nasal mass.   DESCRIPTION OF PROCEDURE:  After adequate endotracheal anesthesia, the  patient received 1 g Ancef IV preoperatively.  The nose was prepped with a  cotton pledget soaked with Afrin and the right nasal mass was injected with  Xylocaine with epinephrine.  The mass was then removed with cup forceps to  provide better visualization of the right nasal cavity.  The portion that  was removed was sent for frozen section.  On removing the mass, it appeared  to arise from the septum.  The inferior turbinate was deviated laterally  from the mass, as was the middle turbinate.  The middle turbinate was  outfractured.  The middle meatus was examined, and the infundibulum-anterior  ethmoid area appeared relatively clear.  Where the mass arose from the  septum, mucosal cuts were made around the stalk down to the periosteum and  perichondrium, and this was removed but the cartilage and bone were left  intact.  Suction cautery was  used for hemostasis.  A Merocel pack was placed  and hydrated with Xylocaine with epinephrine.  The left nasal cavity was  examined and was clear.  The patient had a slight bowing of the septum to  the left.  Frozen section revealed findings consistent with a benign  process.  The patient was subsequently awoken from anesthesia and  transferred to the recovery room postop doing well.    DISPOSITION:  [name] is discharged home later this morning.  Family was  instructed to remove the nasal pack at home this afternoon and to notify my  office if he has any substantial bleeding.  He was given Tylenol and Tylenol  No. 3 p.r.n. pain and Keflex 500 mg b.i.d. for five days.  Kristine Garbe. Ezzard Standing, M.D.    CEN/MEDQ  D:  02/23/2003  T:  02/24/2003  Job:  [id number]   cc:   Melida Quitter, M.D.  [address]  [place]  [place] [address]  Fax: [phone]

## 2010-11-21 NOTE — Discharge Summary (Signed)
NAME:  [name] [name], [name] [name] NO.:  [id number]   MEDICAL RECORD NO.:  [id number]                   PATIENT TYPE:  OIB   LOCATION:  2019                                 FACILITY:  [facility]   PHYSICIAN:  Di Kindle. Edilia Bo, M.D.        DATE OF BIRTH:  [birth year]   DATE OF ADMISSION:  10/19/2002  DATE OF DISCHARGE:  10/22/2002                                 DISCHARGE SUMMARY   ADMISSION DIAGNOSIS:  Ischemic right lower extremity with occluded right  femoropopliteal bypass graft.   PAST MEDICAL HISTORY:  1. 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 Elyn Peers.  2. Hypertension.  3. NO KNOWN DRUG ALLERGIES.   DISCHARGE DIAGNOSES:  Ischemic right lower extremity with occluded  femoropopliteal bypass, status post revision, right femoral to above-the-  knee amputation popliteal bypass.   BRIEF HISTORY AND PHYSICAL:  Mr. Schlarb is a 60 year old Caucasian male who  presented to the [facility] office on 4/15 with a complaint of sudden onset of  pain in his right calf and right foot early in the day.  A lower extremity  duplex performed in the CVTS office noted his right femoropopliteal bypass  graft to be failing.  ABI on the right was inaudible and on the left was  0.32.  This was a change from his routine follow up duplex exam on 09/29/02  which revealed a stable bypass graft with ABI's noted to be 0.85 on the  right and 0.36 on the left.  Dr. Adele Dan recommendation was to undergo  urgent thrombectomy and possible revision of this bypass graft.   HOSPITAL COURSE:  On 10/19/02, the patient was admitted to [facility] under the care of Dr. Edilia Bo.  He underwent the following urgent  surgical procedures:  1. Redo right femoral to above-the-knee amputation popliteal artery bypass     with nonreversed translocated saphenous vein graft.  2. Tibial thrombectomy.  3. Intraoperative  arteriogram.   He tolerated these procedures well and was transferred in stable condition  to the PACU.  He remained hemodynamically stable in the immediate postop  period and his postoperative course has been uneventful.  The morning of  postoperative day #1, the patient was without complaint. He had a brisk  anterior tibial artery Doppler signal. The patient was transferred from the  ICU to the B unit 2000.  ABI's done on postoperative day #1 revealed ABI on  the right to be 0.71, on the left 0.31.  The patient continued to improve  over the next 24 hours. On the morning of 4/17, postoperative day #2, he  continued to feel well, his vital signs were stable and he was afebrile.  He  had 2+ DP pulse on the right.  His incisions were all healing well.  His  pain was well controlled, and he will be ambulating  later today.  If he  continues to progress in this manner, it is anticipated he will be ready for  discharge home tomorrow, 10/22/02.   LABORATORY DATA:  On 4/17, CBC:  WBC 10.5, hemoglobin 11.8, hematocrit 34.6,  platelets 154.  Chemistries included sodium 135, potassium 3.8, BUN 6,  creatinine 0.8, glucose 114, calcium 8.3.   CONDITION ON DISCHARGE:  Improved.    DISCHARGE INSTRUCTIONS:  1. ACTIVITY:  He has been asked to refrain from any driving or any heavy     work. 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  He     is to continue his breathing exercises and daily walking.  2. DIET:  Has no restrictions.  3. WOUND CARE:  He may shower beginning Monday 4/19. Until then, he should     clean his incisions daily with mild soap and water. If his incisions     become red, hot, swollen, draining or if he has a fever greater than 101     degrees Fahrenheit, he is to call Dr. Adele Dan office.   DISCHARGE MEDICATIONS:  1. He has been instructed to resume his home medications of Ziac 1 tablet     daily and 81 mg a day aspirin.  2. He has also been started on  nicotine patch at 21 mg here in-hospital, and     he has been instructed to continue this by changing his patch daily.  He     will be instructed to decrease the dose to 14 mg patch after 3 weeks. He     has been reminded to not smoke with the patch on.  He has been consulted     by the smoking cessation counselor while here in-hospital. She will     follow up with him as an outpatient. He has also been encouraged to     contact his primary care Aailyah Dunbar if he has difficulty with smoking     cessation.  3. For pain management, he may have Tylox, 1-2 p.o. q.4-6h. for moderate to     severe pain or Tylenol 325 mg 1-2 p.o. q.4-6h. for mild pain.   FOLLOW UP:  1. He has an appointment to see the CVTS registered nurse Monday 4/26 for     skin staple removal. The office will call with the time for this     appointment.  2. Dr. Edilia Bo would like to see him at the CVTS office on Wednesday 5/5.     The office will call for the time for this appointment as well.     Toribio Harbour, R.N.                  Di Kindle. Edilia Bo, M.D.    CTK/MEDQ  D:  10/21/2002  T:  10/22/2002  Job:  [id number]   cc:   Holley Bouche, M.D.  [address]  [place], [place] [address]  Fax: [phone]

## 2010-11-21 NOTE — Discharge Summary (Signed)
[facility]  Patient:    ALEXES, LAMARQUE Visit Number: [id number] MRN: [id number]          Service Type: SUR Location: 2000 2018 01 Attending Physician:  Caralee Ates Dictated by:   Sherrie George, P.A. Admit Date:  09/26/2001 Disc. Date: 09/27/01   CC:         Arvella Merles, M.D.   Discharge Summary  DATE OF BIRTH:  08-19-1962  ADMISSION DIAGNOSES: 1. 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 2. Ongoing heavy tobacco use of one and one-half to two packs per days    x 40 years. 3. Hypertension.  DISCHARGE DIAGNOSES: 1. 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 2. Ongoing heavy tobacco use of one and one-half to two packs per days    x 40 years. 3. Hypertension.  PROCEDURES:  Right femoral above-knee popliteal bypass graft with 6 mm Gore-Tex September 26, 2001, Dr. Elyn Peers.  BRIEF HISTORY:  The patient is a 60 year old white male medical patient of Dr. Holley Bouche who had presented with bilateral lower extremity ischemia. He has a long history of heavy tobacco use.  He reports pain in the left calf and foot upon ambulation for approximately six months.  It has been progressive.  He also had pain in his right lower extremity that is not as severe as the left.  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  Otherwise, he denies rest pain in his feet nor has he any nonhealing wounds or tissue loss.  He denies any other acute recent illnesses.  MEDICATIONS ON ADMISSION:  Ziac 1 q.d.  ALLERGIES:  None known.  PAST SURGICAL HISTORY:  None.  PAST MEDICAL HISTORY:  Hypertension.  FAMILY HISTORY:  There was one person with lung cancer, one with prostate and one with asthma problems.  For further History & Physical, please see Dr.  Christean Leaf note.  DIAGNOSTIC DATA:  The patient had previously undergone arteriogram by Dr. Elyn Peers on September 20, 2001.  At that time, he was found to have an occluded right superficial femoral artery at the origin, patent popliteal and anterior tibial on the right.  The left superficial femoral artery was occluded with proximal stenosis, and there was proximal stenosis at the origin of the anterior tibial, and the peroneal artery appears to branch in multiple small collaterals just above the ankle.  The anterior tibial appeared to be patent to the foot, although this flow was slow.  Dr. Elyn Peers recommended femoral-to-above-knee-popliteal bypass graft to the right lower extremity and a femoral-to-anterior-tibial bypass graft to the left lower extremity.  HOSPITAL COURSE:  The patient was admitted at this time for elective surgery and underwent right femoral-above-knee-popliteal bypass graft using 8 mm Gore-Tex graft.  The patient tolerated the procedure well and returned to the recovery room at 3300.  He did well the first postoperative day and was transferred to the floor.  He has been started on progressive ambulation.  He was seen by Dr. Elyn Peers on September 27, 2001, and it was his opinion that, if he continues to do well, he can go home in the next 24 to 48 hours.  Ankle brachial indices postoperatively were 0.53 on the right and 0.34 on the left.  Preoperatively, we were not able to calculate them.  His wounds looked good.  He had a subcuticular closure.  FOLLOWUP:  We will plan for him to return in two weeks with 30-minute Dopplers to Dr. Christean Leaf office.  He was instructed to contact Dr. Holley Bouche for followup in his office.  SPECIAL INSTRUCTIONS:  He has been encouraged to discontinued smoking  DISCHARGE MEDICATIONS: 1. Ziac 1 q.d. 2. Tylox 1 to 2 p.o. q.4h. p.r.n. Dictated by:   Sherrie George, P.A. Attending Physician:  Caralee Ates. DD:  09/27/01 TD:  09/28/01 Job:  [id number]

## 2010-11-21 NOTE — H&P (Signed)
NAME:  [name] [name], [name] [name] NO.:  [id number]   MEDICAL RECORD NO.:  [id number]          PATIENT TYPE:  INP   LOCATION:  1612                         FACILITY:  [facility]   PHYSICIAN:  Sandria Bales. Ezzard Standing, M.D.  DATE OF BIRTH:  22-Dec-1950   DATE OF ADMISSION:  09/24/2006  DATE OF DISCHARGE:                              HISTORY & PHYSICAL   HISTORY OF PRESENT ILLNESS:  This is a 60 year old, white male, who is a  patient of Dr. Leonides Sake, who on Tuesday, September 21, 2006, started  developing abdominal pain and vomiting.  He pointed sort of to his  epigastrium.  This got better by Wednesday, but then by Thursday, March  20, the pain got worse.  Then by yesterday, he said he could hardly  tolerate the pain.  He went to the walk-in clinic at [facility] and was seen  there and then referred to [facility].  He was evaluated last  night at [facility] with an ultrasound and CT scan.  The CT  scan and ultrasound showed gallstone disease with thickened gallbladder  wall consistent with acute cholecystitis.   He denies any other GI history.  Denies any history of peptic ulcer  disease, liver disease, pancreatic disease or colon disease.  He has had  no prior abdominal surgery.   ALLERGIES:  No known drug allergies.   CURRENT MEDICATIONS:  1. Prilosec 20 mg daily.  2. Bisoprolol/hydrochlorothiazide 2.5/6.25 mg daily.  3. Simvastatin 40 mg daily.  4. Baby aspirin one a day.   PAST SURGICAL HISTORY:  1. Two operations on his right leg, once in 2003, and the last in      April 2004, with a right femoral popliteal by Dr. Edilia Bo.  2. 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 Narda Bonds.   REVIEW OF SYSTEMS:  NEUROLOGIC:  No seizures or loss of consciousness.  PULMONARY:  He smoked cigarettes up until 5  years ago.  He still has a  little bit of a smoker's cough.  CARDIAC:  He has had hypertension, but no chest pain or angina.  GASTROINTESTINAL:  He had his head and neck cancer treated by Dr. Narda Bonds.  He also had adjuvant chemotherapy/irradiation by Dr. Clelia Croft and  Dr. Roselind Messier all in 2006.  He is doing well from that.  See the remainder  of my H&P for his abdominal gastrointestinal symptoms.  GENITOURINARY:  No kidney stones or kidney infections.  ENDOCRINE:  He does have hypercholesterolemia.  He is accompanied by his  wife and his one son with him.   PHYSICAL EXAMINATION:  VITAL SIGNS:  Temperature 97.1, pulse 81, blood  pressure 123/61.  GENERAL:  Well-nourished, thin, white male, alert and cooperative on  physical exam.  HEENT:  Defect of his right neck from a prior right  modified neck  dissection.  He is edentulous, but has no palpable mass or nodule.  NECK:  He has no supraclavicular or axillary adenopathy.  LUNGS:  Clear to auscultation.  HEART:  Regular rate and rhythm without murmurs, rubs or gallops.  ABDOMEN:  He is tender in his epigastric and both upper quadrants with  maybe some mild guarding, decreased bowel sounds, no abdominal scars.  EXTREMITIES:  He has a scar of his right leg consistent with a previous  right femoral popliteal.   LABORATORY DATA AND X-RAY FINDINGS:  White blood count 23,000,  hemoglobin 13, hematocrit 37, 91% neutrophils.  Sodium 133, potassium  4.4, chloride 95, CO2 28, glucose 118, BUN 24, creatinine 1.4.  Albumin  3.3, lipase 17.  Urinalysis negative.   Review of his CT and ultrasound again show evidence of acute  cholecystitis with gallstones.   IMPRESSION/PLAN:  1. Acute cholecystitis with gallstones.  Discussed with the patient about proceeding with gallbladder surgery  today.  The potential indications and risks of surgery were explained.  Potential risks include, but not limited to, bleeding, infection, bile  duct injury and  the possibility of open surgery.  1. History of head and neck cancer in which he has done well and      disease free at this time.  2. Right femoral popliteal.  He actually says his left leg has about      30% of the blood flow where his right leg has 80-90% of his blood      flow.  3. He quit smoking in 2003.  4. Hypertension.  5. Hypercholesterolemia.  6. Use of aspirin.      Sandria Bales. Ezzard Standing, M.D.  Electronically Signed     DHN/MEDQ  D:  09/25/2006  T:  09/25/2006  Job:  [id number]   cc:   Holley Bouche, M.D.  Fax: [phone]   Kristine Garbe. Ezzard Standing, M.D.  Fax: [phone]   Blenda Nicely. Katheran Awe, M.D.  Fax: [phone]   Di Kindle. Edilia Bo, M.D.  [address]  [place]  [place] [address]

## 2012-06-22 ENCOUNTER — Encounter: Payer: Self-pay | Admitting: Vascular Surgery

## 2014-01-13 ENCOUNTER — Emergency Department (HOSPITAL_BASED_OUTPATIENT_CLINIC_OR_DEPARTMENT_OTHER): Payer: Medicare Other

## 2014-01-13 ENCOUNTER — Inpatient Hospital Stay (HOSPITAL_BASED_OUTPATIENT_CLINIC_OR_DEPARTMENT_OTHER)
Admission: EM | Admit: 2014-01-13 | Discharge: 2014-01-16 | DRG: 065 | Disposition: A | Discharge status: Home / Self Care | Payer: Medicare Other | Attending: Internal Medicine | Admitting: Internal Medicine

## 2014-01-13 ENCOUNTER — Encounter (HOSPITAL_BASED_OUTPATIENT_CLINIC_OR_DEPARTMENT_OTHER): Payer: Self-pay | Admitting: Emergency Medicine

## 2014-01-13 DIAGNOSIS — I6350 Cerebral infarction due to unspecified occlusion or stenosis of unspecified cerebral artery: Principal | ICD-10-CM | POA: Diagnosis present

## 2014-01-13 DIAGNOSIS — N289 Disorder of kidney and ureter, unspecified: Secondary | ICD-10-CM | POA: Diagnosis present

## 2014-01-13 DIAGNOSIS — I517 Cardiomegaly: Secondary | ICD-10-CM | POA: Diagnosis not present

## 2014-01-13 DIAGNOSIS — I639 Cerebral infarction, unspecified: Secondary | ICD-10-CM | POA: Diagnosis present

## 2014-01-13 DIAGNOSIS — G8190 Hemiplegia, unspecified affecting unspecified side: Secondary | ICD-10-CM | POA: Diagnosis present

## 2014-01-13 DIAGNOSIS — R471 Dysarthria and anarthria: Secondary | ICD-10-CM | POA: Diagnosis present

## 2014-01-13 DIAGNOSIS — E785 Hyperlipidemia, unspecified: Secondary | ICD-10-CM | POA: Diagnosis present

## 2014-01-13 DIAGNOSIS — Z7982 Long term (current) use of aspirin: Secondary | ICD-10-CM

## 2014-01-13 DIAGNOSIS — Z9221 Personal history of antineoplastic chemotherapy: Secondary | ICD-10-CM

## 2014-01-13 DIAGNOSIS — I739 Peripheral vascular disease, unspecified: Secondary | ICD-10-CM | POA: Diagnosis present

## 2014-01-13 DIAGNOSIS — Z87891 Personal history of nicotine dependence: Secondary | ICD-10-CM | POA: Diagnosis not present

## 2014-01-13 DIAGNOSIS — I6529 Occlusion and stenosis of unspecified carotid artery: Secondary | ICD-10-CM | POA: Diagnosis not present

## 2014-01-13 DIAGNOSIS — R4789 Other speech disturbances: Secondary | ICD-10-CM | POA: Diagnosis present

## 2014-01-13 DIAGNOSIS — Z85819 Personal history of malignant neoplasm of unspecified site of lip, oral cavity, and pharynx: Secondary | ICD-10-CM | POA: Diagnosis not present

## 2014-01-13 DIAGNOSIS — C051 Malignant neoplasm of soft palate: Secondary | ICD-10-CM | POA: Diagnosis present

## 2014-01-13 DIAGNOSIS — R531 Weakness: Secondary | ICD-10-CM

## 2014-01-13 DIAGNOSIS — R269 Unspecified abnormalities of gait and mobility: Secondary | ICD-10-CM | POA: Diagnosis present

## 2014-01-13 DIAGNOSIS — K219 Gastro-esophageal reflux disease without esophagitis: Secondary | ICD-10-CM | POA: Diagnosis present

## 2014-01-13 DIAGNOSIS — R209 Unspecified disturbances of skin sensation: Secondary | ICD-10-CM | POA: Diagnosis present

## 2014-01-13 DIAGNOSIS — I10 Essential (primary) hypertension: Secondary | ICD-10-CM | POA: Diagnosis not present

## 2014-01-13 DIAGNOSIS — Z923 Personal history of irradiation: Secondary | ICD-10-CM | POA: Diagnosis not present

## 2014-01-13 DIAGNOSIS — M6281 Muscle weakness (generalized): Secondary | ICD-10-CM | POA: Diagnosis not present

## 2014-01-13 HISTORY — DX: Malignant (primary) neoplasm, unspecified: C80.1

## 2014-01-13 HISTORY — DX: Hyperlipidemia, unspecified: E78.5

## 2014-01-13 HISTORY — DX: Peripheral vascular disease, unspecified: I73.9

## 2014-01-13 HISTORY — DX: Gastro-esophageal reflux disease without esophagitis: K21.9

## 2014-01-13 LAB — COMPREHENSIVE METABOLIC PANEL
ALBUMIN: 3.9 g/dL (ref 3.5–5.2)
ALT: 17 U/L (ref 0–53)
AST: 19 U/L (ref 0–37)
Alkaline Phosphatase: 108 U/L (ref 39–117)
Anion gap: 15 (ref 5–15)
BUN: 11 mg/dL (ref 6–23)
CALCIUM: 9.8 mg/dL (ref 8.4–10.5)
CO2: 23 mEq/L (ref 19–32)
CREATININE: 1.5 mg/dL — AB (ref 0.50–1.35)
Chloride: 99 mEq/L (ref 96–112)
GFR calc Af Amer: 55 mL/min — ABNORMAL LOW (ref >90)
GFR calc non Af Amer: 47 mL/min — ABNORMAL LOW (ref >90)
Glucose, Bld: 136 mg/dL — ABNORMAL HIGH (ref 70–99)
Potassium: 3.7 mEq/L (ref 3.7–5.3)
SODIUM: 137 meq/L (ref 137–147)
Total Bilirubin: 0.5 mg/dL (ref 0.3–1.2)
Total Protein: 7.3 g/dL (ref 6.0–8.3)

## 2014-01-13 LAB — DIFFERENTIAL
BASOS PCT: 1 % (ref 0–1)
Basophils Absolute: 0.1 10*3/uL (ref 0.0–0.1)
Eosinophils Absolute: 0.3 10*3/uL (ref 0.0–0.7)
Eosinophils Relative: 3 % (ref 0–5)
Lymphocytes Relative: 17 % (ref 12–46)
Lymphs Abs: 1.6 10*3/uL (ref 0.7–4.0)
MONO ABS: 0.8 10*3/uL (ref 0.1–1.0)
Monocytes Relative: 8 % (ref 3–12)
Neutro Abs: 6.7 10*3/uL (ref 1.7–7.7)
Neutrophils Relative %: 71 % (ref 43–77)

## 2014-01-13 LAB — APTT: aPTT: 34 seconds (ref 24–37)

## 2014-01-13 LAB — PROTIME-INR
INR: 1 (ref 0.00–1.49)
Prothrombin Time: 13.2 seconds (ref 11.6–15.2)

## 2014-01-13 LAB — CBC
HEMATOCRIT: 38.6 % — AB (ref 39.0–52.0)
Hemoglobin: 12.9 g/dL — ABNORMAL LOW (ref 13.0–17.0)
MCH: 32.7 pg (ref 26.0–34.0)
MCHC: 33.4 g/dL (ref 30.0–36.0)
MCV: 98 fL (ref 78.0–100.0)
PLATELETS: 237 10*3/uL (ref 150–400)
RBC: 3.94 MIL/uL — ABNORMAL LOW (ref 4.22–5.81)
RDW: 13.4 % (ref 11.5–15.5)
WBC: 9.5 10*3/uL (ref 4.0–10.5)

## 2014-01-13 LAB — TROPONIN I

## 2014-01-13 MED ORDER — ASPIRIN 300 MG RE SUPP: 300.0000 mg | Freq: Every day | RECTAL | Status: DC

## 2014-01-13 MED ORDER — ACETAMINOPHEN 325 MG PO TABS: 650.0000 mg | ORAL_TABLET | ORAL | Status: DC | PRN

## 2014-01-13 MED ORDER — ACETAMINOPHEN 650 MG RE SUPP: 650.0000 mg | RECTAL | Status: DC | PRN

## 2014-01-13 MED ORDER — STROKE: EARLY STAGES OF RECOVERY BOOK
Freq: Once | Status: AC
  Administered 2014-01-13: 22:00:00
  Filled 2014-01-13: qty 1

## 2014-01-13 MED ORDER — SODIUM CHLORIDE 0.9 % IV SOLN
INTRAVENOUS | Status: DC
  Administered 2014-01-13 – 2014-01-14 (×2): via INTRAVENOUS

## 2014-01-13 MED ORDER — ASPIRIN 325 MG PO TABS
325.0000 mg | ORAL_TABLET | Freq: Every day | ORAL | Status: DC
Start: 2014-01-13 — End: 2014-01-16
  Administered 2014-01-14 – 2014-01-16 (×3): 325 mg via ORAL
  Filled 2014-01-13 (×3): qty 1

## 2014-01-13 MED ORDER — ENOXAPARIN SODIUM 40 MG/0.4ML ~~LOC~~ SOLN
40.0000 mg | SUBCUTANEOUS | Status: DC
  Administered 2014-01-13 – 2014-01-15 (×3): 40 mg via SUBCUTANEOUS
  Filled 2014-01-13 (×3): qty 0.4

## 2014-01-13 NOTE — ED Notes (Signed)
Thursday patient woke up from a nap and stated his right arm was numb. At that time he developed a right facial droop, pt states he "can't talk right", his right arm is weak. Pt has an obvious facial droop.

## 2014-01-13 NOTE — Consult Note (Signed)
Neurology Consultation Reason for Consult: Stroke Referring Physician: Conley Canal, C.  CC: Right-sided weakness  History is obtained from: Patient, family  HPI: [name] [name] is a 63 y.o. male who was in his normal state of health up until 2 days ago at which point he developed right-sided weakness. He was lacking better so he sought medical care today.  It is a static deficit since onset. He had an MRI done which shows a deep white matter infarct.   LKW: 7/9 tpa given?: no, outside of window    ROS: A 14 point ROS was performed and is negative except as noted in the HPI.   Past Medical History  Diagnosis Date  . Hyperlipemia   . Cancer     throat, lymph node  . GERD (gastroesophageal reflux disease)   . PVD (peripheral vascular disease)     Family History: No history of stroke  Social History: Tob: Former smoker  Exam: Current vital signs: BP 168/77  Pulse 65  Temp(Src) 97.4 F (36.3 C) (Oral)  Resp 18  Ht 5\' 4"  (1.626 m)  Wt 73.936 kg (163 lb)  BMI 27.97 kg/m2  SpO2 99% Vital signs in last 24 hours: Temp:  [97.4 F (36.3 C)-98 F (36.7 C)] 97.4 F (36.3 C) (07/11 1902) Pulse Rate:  [56-103] 65 (07/11 1902) Resp:  [16-18] 18 (07/11 1902) BP: (147-175)/(73-86) 168/77 mmHg (07/11 1902) SpO2:  [98 %-99 %] 99 % (07/11 1902) Weight:  [73.936 kg (163 lb)] 73.936 kg (163 lb) (07/11 1250)  General: In bed, NAD CV: Regular in rhythm Mental Status: Patient is awake, alert, oriented to person, place, month, year, and situation. Immediate and remote memory are intact. Patient is able to give a clear and coherent history. No signs of aphasia or neglect Cranial Nerves: II: Visual Fields are full. Pupils are equal, round, and reactive to light.  Discs are difficult to visualize. III,IV, VI: EOMI without ptosis or diploplia.  V: Facial sensation is symmetric to temperature VII: Facial movement is notable for mild right facial weakness VIII: hearing is intact to  voice X: Uvula elevates symmetrically XI: Shoulder shrug is symmetric. XII: tongue deviates to the right mildly Motor: Tone is normal. Bulk is normal. 5/5 strength was present on the left, 4/5 in the right arm and leg. Sensory: Sensation is symmetric to light touch and temperature in the arms and legs. Deep Tendon Reflexes: 2+ and symmetric in the biceps and patellae.  Cerebellar: FNF and HKS are intact on the left, consistent with weakness on the right. Gait: Not performed secondary to weakness.         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 He was taking aspirin prior to admission.  Recommendations: 1. HgbA1c, fasting lipid panel 2. MRI, MRA  of the brain without contrast 3. Frequent neuro checks 4. Echocardiogram 5. Carotid dopplers 6. Prophylactic therapy-Antiplatelet med: Aspirin - dose 325mg  PO or 300mg  PR 7. Risk factor modification 8. Telemetry monitoring 9. PT consult, OT consult, Speech consult    Roland Rack, MD [facility] [phone]  If 7pm- 7am, please page neurology on call as listed in [place].

## 2014-01-13 NOTE — H&P (Addendum)
[facility] History and Physical  [name] [name] XVQ:[id number] DOB: October 19, 1950 DOA: 01/13/2014  Referring physician: Tamera Punt [facility] [place]   Chief Complaint: speech difficulty  HPI: [name] [name] is a 63 y.o. male  With h/o PVD, hyperlipidemia, SSCa soft palate presents to [facility] with slurred speech, right hand and arm numbness and weakness, drooling and unsteady gait.  His symptoms started 2 days ago, but he did not seek medical attention, because he "thought it might be allergies". No previous h/o stroke.  Denies chest pain or palpitations. He has noted some coughing with liquids. He has continued to eat and take his pills.  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  He is on a statin and baby aspirin a day.   Review of Systems:  Systems reviewed. As above otherwise negative.  Past Medical History  Diagnosis Date  . Hyperlipemia   . Cancer     throat, lymph node  . GERD (gastroesophageal reflux disease)   . PVD (peripheral vascular disease)    Past Surgical History  Procedure Laterality Date  . Cholecystectomy    . Lymphadenectomy Right 2009    neck   right leg bypass procedure  Social History:  reports that he has quit smoking. He does not have any smokeless tobacco history on file. He reports that he does not drink alcohol or use illicit drugs. he is married.  No Known Allergies  Family History  Problem Relation Age of Onset  . Lung cancer Mother   . CAD Father      Prior to Admission medications   Medication Sig Start Date End Date Taking? Authorizing Provider  aspirin 81 MG tablet Take 81 mg by mouth daily.   Yes Historical Provider, MD  atorvastatin (LIPITOR) 20 MG tablet Take 20 mg by mouth daily.   Yes Historical Provider, MD  omeprazole (PRILOSEC) 10 MG capsule Take 10 mg by mouth daily.   Yes Historical Provider, MD   Physical Exam: Filed Vitals:   01/13/14 1711  BP: 160/73  Pulse: 56  Temp: 98 F (36.7 C)  Resp: 18    BP 160/73  Pulse 56  Temp(Src) 98 F (36.7 C) (Oral)  Resp 18  Ht 5\' 4"  (1.626 m)  Wt 73.936 kg (163 lb)  BMI 27.97 kg/m2  SpO2 98% BP 160/73  Pulse 56  Temp(Src) 98 F (36.7 C) (Oral)  Resp 18  Ht 5\' 4"  (1.626 m)  Wt 73.936 kg (163 lb)  BMI 27.97 kg/m2  SpO2 98%  General Appearance:    Alert, cooperative, occasionally tearful   Head:    Normocephalic, without obvious abnormality, atraumatic  Eyes:    PERRL, conjunctiva/corneas clear, EOM's intact,          Nose:   Nares normal, septum midline, mucosa normal, no drainage   or sinus tenderness  Throat:   Lips, mucosa, and tongue normal; teeth and gums normal  Neck:   no carotid bruits thyromegaly or lymphadenopathy. Right neck with surgical scars   Back:     Symmetric, no curvature, ROM normal, no CVA tenderness  Lungs:     Clear to auscultation bilaterally, respirations unlabored  Chest wall:    No tenderness or deformity  Heart:    Regular rate and rhythm, S1 and S2 normal, no murmur, rub   or gallop  Abdomen:     Soft, non-tender, bowel sounds  active all four quadrants,    no masses, no organomegaly  Genitalia:   deferred   Rectal:   deferred   Extremities:   Extremities normal, atraumatic, no cyanosis or edema  Pulses:   2+ and symmetric all extremities  Skin:   Skin color, texture, turgor normal, no rashes or lesions  Lymph nodes:   Cervical, supraclavicular, and axillary nodes normal  Neurologic:   speech is slurred. Right upper tremor the strength 4/5 with diminished light touch sensation and difficulty with finger to nose. Motor strength in the lower extremities 5 out of 5.did not test gait.            psychiatric: Occasionally tearful. Cooperative.   Labs on Admission:  Basic Metabolic Panel:  Recent Labs Lab 01/13/14 1300  NA 137  K 3.7  CL 99  CO2 23  GLUCOSE 136*  BUN 11  CREATININE 1.50*  CALCIUM 9.8   Liver  Function Tests:  Recent Labs Lab 01/13/14 1300  AST 19  ALT 17  ALKPHOS 108  BILITOT 0.5  PROT 7.3  ALBUMIN 3.9   No results found for this basename: LIPASE, AMYLASE,  in the last 168 hours No results found for this basename: AMMONIA,  in the last 168 hours CBC:  Recent Labs Lab 01/13/14 1300  WBC 9.5  NEUTROABS 6.7  HGB 12.9*  HCT 38.6*  MCV 98.0  PLT 237   Cardiac Enzymes:  Recent Labs Lab 01/13/14 1300  TROPONINI <0.30    BNP (last 3 results) No results found for this basename: PROBNP,  in the last 8760 hours CBG: No results found for this basename: GLUCAP,  in the last 168 hours  Radiological Exams on Admission: Dg Chest 2 View  01/13/2014   CLINICAL DATA:  Right facial droop and difficulty speaking. Question aspiration  EXAM: CHEST  2 VIEW  COMPARISON:  CT chest 07/13/2006.  PA and lateral chest 02/20/2005.  FINDINGS: The lungs demonstrates some emphysematous disease but are clear. Heart size is normal. No pneumothorax or pleural effusion. Cholecystectomy clips are noted. No focal bony abnormality.  IMPRESSION: No acute disease.   Electronically Signed   By: Inge Rise M.D.   On: 01/13/2014 15:30   Ct Head Wo Contrast  01/13/2014   CLINICAL DATA:  Sudden onset slurred speech. History of head neck cancer  EXAM: CT HEAD WITHOUT CONTRAST  TECHNIQUE: Contiguous axial images were obtained from the base of the skull through the vertex without intravenous contrast.  COMPARISON:  Head CT 12/10/2004  FINDINGS: No acute intracranial hemorrhage. No focal mass lesion. No CT evidence of acute infarction. No midline shift or mass effect. No hydrocephalus. Basilar cisterns are patent.  There is extensive white matter hypodensities in the left corona radiata as well as periventricular of white matter. Paranasal sinuses and mastoid air cells are clear.  IMPRESSION: 1. No acute intracranial findings. 2. No intracranial hemorrhage. 3. White matter infarction in the left corona  radiata unchanged from prior.   Electronically Signed   By: Suzy Bouchard M.D.   On: 01/13/2014 13:05   Mr Brain Wo Contrast  01/13/2014   CLINICAL DATA:  Stroke symptoms with speech difficulty beginning 48-72 hr earlier. Right arm numbness. Imbalance. Right facial droop.  EXAM: MRI HEAD WITHOUT CONTRAST  TECHNIQUE: Multiplanar, multiecho pulse sequences of the brain and surrounding structures were obtained without intravenous contrast.  COMPARISON:  CT head 01/13/2014 earlier in the day.  FINDINGS: There is an acute deep white matter infarct  in the left centrum semiovale 9 x 13 mm cross-section without significant mass effect or hemorrhage. No other areas of acute infarction are observed. There is no mass lesion or hydrocephalus, and no extra-axial fluid.  Premature for age cerebral and cerebellar atrophy. Moderately extensive T2 and FLAIR hyperintensities throughout the periventricular and subcortical white matter, also affecting the brainstem consistent with chronic microvascular ischemic change. Remote lacunar infarcts are present, most notably in the bilateral basal ganglia and right pons. Flow voids are maintained. Pituitary, pineal, and cerebellar tonsils unremarkable. No upper cervical lesions. Minor chronic sinus disease. Negative orbits and mastoids.  Compared with prior CT, the acute infarct is indistinguishable from areas of chronic ischemia.  IMPRESSION: Acute deep white matter infarct left centrum semiovale. No hemorrhage or mass effect.   Electronically Signed   By: Rolla Flatten M.D.   On: 01/13/2014 16:30    EKG: NSR  Assessment/Plan Principal Problem:   Acute ischemic stroke: Not a TPA candidate due to delay in presentation. Will get echocardiogram, carotid Dopplers, fasting lipids, hemoglobin A1c, physical therapy, occupational therapy, speech therapy, remain on telemetry. Neuro checks. Aspirin therapy for now. Have consulted neurology. 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

## 2014-01-13 NOTE — Treatment Plan (Signed)
Discussed case with Dr. Tamera Punt  63yo that since Thursday (7/9), had R facial droop, aphasia, R sided weakness, ataxia, In ED, R arm, slow finger to nose on R.   CT head unremarkable and MRI pending and likely able to be done prior to transfer to [facility].  Pt accepted to med-tele floor for CVA work-up

## 2014-01-13 NOTE — ED Provider Notes (Addendum)
CSN: [id number]     Arrival date & time 01/13/14  1246 History   First MD Initiated Contact with Patient 01/13/14 1254     Chief Complaint  Patient presents with  . Stroke Symptoms     (Consider location/radiation/quality/duration/timing/severity/associated sxs/prior Treatment) HPI Comments: Patient with a history of hypertension and hyperlipidemia presents with right-sided facial drooping and right-sided arm weakness. He also has trouble speaking. He states on Thursday which was 3 days ago, he was asymptomatic and took a nap. When he woke up from that he was having these symptoms which have been persistent since that time. He does note some ataxia. He notes that his right arm feels weaker than the left arm he has some numbness in his right arm. He denies any weakness in his legs. He does have some changes speech and his weight has had difficulty understanding his speech. He denies any history of stroke symptoms in the past. He did have some kind of neck cancer with surgery to remove multiple lymph nodes in 2008. He was subsequently cleared by his physician and has had no known recurrence.   Past Medical History  Diagnosis Date  . Hypertension   . Hyperlipemia   . Cancer     throat, lymph node   Past Surgical History  Procedure Laterality Date  . Cholecystectomy     No family history on file. History  Substance Use Topics  . Smoking status: Former Research scientist (life sciences)  . Smokeless tobacco: Not on file  . Alcohol Use: No    Review of Systems  Constitutional: Negative for fever, chills, diaphoresis and fatigue.  HENT: Negative for congestion, rhinorrhea and sneezing.   Eyes: Negative.   Respiratory: Negative for cough, chest tightness and shortness of breath.   Cardiovascular: Negative for chest pain and leg swelling.  Gastrointestinal: Negative for nausea, vomiting, abdominal pain, diarrhea and blood in stool.  Genitourinary: Negative for frequency, hematuria, flank pain and difficulty  urinating.  Musculoskeletal: Negative for arthralgias and back pain.  Skin: Negative for rash.  Neurological: Positive for facial asymmetry, speech difficulty, weakness and numbness. Negative for dizziness and headaches.      Allergies  Review of patient's allergies indicates no known allergies.  Home Medications   Prior to Admission medications   Medication Sig Start Date End Date Taking? Authorizing Provider  aspirin 81 MG tablet Take 81 mg by mouth daily.   Yes Historical Provider, MD  atorvastatin (LIPITOR) 20 MG tablet Take 20 mg by mouth daily.   Yes Historical Provider, MD  omeprazole (PRILOSEC) 10 MG capsule Take 10 mg by mouth daily.   Yes Historical Provider, MD   BP 175/77  Pulse 67  Temp(Src) 97.6 F (36.4 C) (Oral)  Resp 16  Ht 5\' 4"  (1.626 m)  Wt 163 lb (73.936 kg)  BMI 27.97 kg/m2  SpO2 98% Physical Exam  Constitutional: He is oriented to person, place, and time. He appears well-developed and well-nourished.  HENT:  Head: Normocephalic and atraumatic.  Eyes: Pupils are equal, round, and reactive to light.  Neck: Normal range of motion. Neck supple.  Cardiovascular: Normal rate, regular rhythm and normal heart sounds.   Pulmonary/Chest: Effort normal and breath sounds normal. No respiratory distress. He has no wheezes. He has no rales. He exhibits no tenderness.  Abdominal: Soft. Bowel sounds are normal. There is no tenderness. There is no rebound and no guarding.  Musculoskeletal: Normal range of motion. He exhibits no edema.  Lymphadenopathy:    He has no  cervical adenopathy.  Neurological: He is alert and oriented to person, place, and time.  Motor strength is 4/5 in the right upper extremity and 5 out of 5 in all other extremities. No pronator drift is noted. He has diminished sensation to light touch in the right upper extremity. He does have a delayed finger to nose on the right as compared to the left. He has some right-sided facial drooping and numbness  to the right side of the face on light touch.  Skin: Skin is warm and dry. No rash noted.  Psychiatric: He has a normal mood and affect.    ED Course  Procedures (including critical care time) Labs Review Results for orders placed during the hospital encounter of 01/13/14  PROTIME-INR      Result Value Ref Range   Prothrombin Time 13.2  11.6 - 15.2 seconds   INR 1.00  0.00 - 1.49  APTT      Result Value Ref Range   aPTT 34  24 - 37 seconds  CBC      Result Value Ref Range   WBC 9.5  4.0 - 10.5 K/uL   RBC 3.94 (*) 4.22 - 5.81 MIL/uL   Hemoglobin 12.9 (*) 13.0 - 17.0 g/dL   HCT 38.6 (*) 39.0 - 52.0 %   MCV 98.0  78.0 - 100.0 fL   MCH 32.7  26.0 - 34.0 pg   MCHC 33.4  30.0 - 36.0 g/dL   RDW 13.4  11.5 - 15.5 %   Platelets 237  150 - 400 K/uL  DIFFERENTIAL      Result Value Ref Range   Neutrophils Relative % 71  43 - 77 %   Neutro Abs 6.7  1.7 - 7.7 K/uL   Lymphocytes Relative 17  12 - 46 %   Lymphs Abs 1.6  0.7 - 4.0 K/uL   Monocytes Relative 8  3 - 12 %   Monocytes Absolute 0.8  0.1 - 1.0 K/uL   Eosinophils Relative 3  0 - 5 %   Eosinophils Absolute 0.3  0.0 - 0.7 K/uL   Basophils Relative 1  0 - 1 %   Basophils Absolute 0.1  0.0 - 0.1 K/uL  COMPREHENSIVE METABOLIC PANEL      Result Value Ref Range   Sodium 137  137 - 147 mEq/L   Potassium 3.7  3.7 - 5.3 mEq/L   Chloride 99  96 - 112 mEq/L   CO2 23  19 - 32 mEq/L   Glucose, Bld 136 (*) 70 - 99 mg/dL   BUN 11  6 - 23 mg/dL   Creatinine, Ser 1.50 (*) 0.50 - 1.35 mg/dL   Calcium 9.8  8.4 - 10.5 mg/dL   Total Protein 7.3  6.0 - 8.3 g/dL   Albumin 3.9  3.5 - 5.2 g/dL   AST 19  0 - 37 U/L   ALT 17  0 - 53 U/L   Alkaline Phosphatase 108  39 - 117 U/L   Total Bilirubin 0.5  0.3 - 1.2 mg/dL   GFR calc non Af Amer 47 (*) >90 mL/min   GFR calc Af Amer 55 (*) >90 mL/min   Anion gap 15  5 - 15  TROPONIN I      Result Value Ref Range   Troponin I <0.30  <0.30 ng/mL   Dg Chest 2 View  01/13/2014   CLINICAL DATA:  Right  facial droop and difficulty speaking. Question aspiration  EXAM: CHEST  2 VIEW  COMPARISON:  CT chest 07/13/2006.  PA and lateral chest 02/20/2005.  FINDINGS: The lungs demonstrates some emphysematous disease but are clear. Heart size is normal. No pneumothorax or pleural effusion. Cholecystectomy clips are noted. No focal bony abnormality.  IMPRESSION: No acute disease.   Electronically Signed   By: Inge Rise M.D.   On: 01/13/2014 15:30   Ct Head Wo Contrast  01/13/2014   CLINICAL DATA:  Sudden onset slurred speech. History of head neck cancer  EXAM: CT HEAD WITHOUT CONTRAST  TECHNIQUE: Contiguous axial images were obtained from the base of the skull through the vertex without intravenous contrast.  COMPARISON:  Head CT 12/10/2004  FINDINGS: No acute intracranial hemorrhage. No focal mass lesion. No CT evidence of acute infarction. No midline shift or mass effect. No hydrocephalus. Basilar cisterns are patent.  There is extensive white matter hypodensities in the left corona radiata as well as periventricular of white matter. Paranasal sinuses and mastoid air cells are clear.  IMPRESSION: 1. No acute intracranial findings. 2. No intracranial hemorrhage. 3. White matter infarction in the left corona radiata unchanged from prior.   Electronically Signed   By: Suzy Bouchard M.D.   On: 01/13/2014 13:05      Imaging Review Dg Chest 2 View  01/13/2014   CLINICAL DATA:  Right facial droop and difficulty speaking. Question aspiration  EXAM: CHEST  2 VIEW  COMPARISON:  CT chest 07/13/2006.  PA and lateral chest 02/20/2005.  FINDINGS: The lungs demonstrates some emphysematous disease but are clear. Heart size is normal. No pneumothorax or pleural effusion. Cholecystectomy clips are noted. No focal bony abnormality.  IMPRESSION: No acute disease.   Electronically Signed   By: Inge Rise M.D.   On: 01/13/2014 15:30   Ct Head Wo Contrast  01/13/2014   CLINICAL DATA:  Sudden onset slurred speech.  History of head neck cancer  EXAM: CT HEAD WITHOUT CONTRAST  TECHNIQUE: Contiguous axial images were obtained from the base of the skull through the vertex without intravenous contrast.  COMPARISON:  Head CT 12/10/2004  FINDINGS: No acute intracranial hemorrhage. No focal mass lesion. No CT evidence of acute infarction. No midline shift or mass effect. No hydrocephalus. Basilar cisterns are patent.  There is extensive white matter hypodensities in the left corona radiata as well as periventricular of white matter. Paranasal sinuses and mastoid air cells are clear.  IMPRESSION: 1. No acute intracranial findings. 2. No intracranial hemorrhage. 3. White matter infarction in the left corona radiata unchanged from prior.   Electronically Signed   By: Suzy Bouchard M.D.   On: 01/13/2014 13:05     EKG Interpretation   Date/Time:  Saturday January 13 2014 13:31:30 EDT Ventricular Rate:  69 PR Interval:  160 QRS Duration: 84 QT Interval:  378 QTC Calculation: 405 R Axis:   61 Text Interpretation:  Normal sinus rhythm Normal ECG Since last tracing  rate slower Confirmed by Ishaq Maffei  MD, Beckhem Isadore ([id number]) on 01/13/2014 4:00:21  PM      MDM   Final diagnoses:  Right sided weakness    PT with right side weakness, dysarthria, facial drooping.  Outside the window for tPA or intervention.  CT neg for bleed.  Will admit for further stroke work-up.  Discussed with Dr. Wyline Copas who has accepted the pt for transfer.    Malvin Johns, MD 01/13/14 [place], MD 01/13/14 1600

## 2014-01-14 DIAGNOSIS — M6281 Muscle weakness (generalized): Secondary | ICD-10-CM

## 2014-01-14 DIAGNOSIS — I517 Cardiomegaly: Secondary | ICD-10-CM

## 2014-01-14 LAB — LIPID PANEL
CHOL/HDL RATIO: 3.8 ratio
CHOLESTEROL: 122 mg/dL (ref 0–200)
HDL: 32 mg/dL — ABNORMAL LOW (ref >39)
LDL Cholesterol: 72 mg/dL (ref 0–99)
Triglycerides: 90 mg/dL (ref <150)
VLDL: 18 mg/dL (ref 0–40)

## 2014-01-14 LAB — HEMOGLOBIN A1C
HEMOGLOBIN A1C: 5.4 % (ref <5.7)
Mean Plasma Glucose: 108 mg/dL (ref <117)

## 2014-01-14 MED ORDER — ATORVASTATIN CALCIUM 10 MG PO TABS
20.0000 mg | ORAL_TABLET | Freq: Every day | ORAL | Status: DC
  Administered 2014-01-14 – 2014-01-15 (×2): 20 mg via ORAL
  Filled 2014-01-14 (×2): qty 2

## 2014-01-14 MED ORDER — PANTOPRAZOLE SODIUM 40 MG PO TBEC
40.0000 mg | DELAYED_RELEASE_TABLET | Freq: Every day | ORAL | Status: DC
  Administered 2014-01-14 – 2014-01-16 (×3): 40 mg via ORAL
  Filled 2014-01-14 (×3): qty 1

## 2014-01-14 NOTE — Progress Notes (Signed)
[facility] PROGRESS NOTE  [name] [name] AOZ:[id number] DOB: May 27, 1951 DOA: 01/13/2014 PCP: No primary provider on file.  Assessment/Plan: Principal Problem:  Acute ischemic stroke: Not a TPA candidate due to delay in presentation.  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:  -resected 2006 s/p chemoradiation, reportedly in remission Renal Insufficiency- acute vs chronic - follow and recheck with hydration   Code Status: full Family Communication: family at bedside  Disposition Plan: pending PT EVAL   Consultants:  Neuro  Procedures:  Echo- pending  Carotid doppler- pending  Antibiotics:  none  HPI/Subjective: Still with dysarthria, still with numbness, weakness better  Objective: Filed Vitals:   01/14/14 0900  BP: 127/70  Pulse: 63  Temp: 97.7 F (36.5 C)  Resp: 20    Intake/Output Summary (Last 24 hours) at 01/14/14 1007 Last data filed at 01/14/14 0600  Gross per 24 hour  Intake      0 ml  Output    275 ml  Net   -275 ml   Filed Weights   01/13/14 1250  Weight: 73.936 kg (163 lb)    Exam:  General: alert & oriented x 3 In NAD, dysarthric Cardiovascular: RRR, nl S1 s2 Respiratory: CTAB Abdomen: soft +BS NT/ND, no masses palpable Extremities: No cyanosis and no edema Neuro: RUE 4/5 strength, rest of extremities 5/5     Data Reviewed: Basic Metabolic Panel:  Recent Labs Lab 01/13/14 1300  NA 137  K 3.7  CL 99  CO2 23  GLUCOSE 136*  BUN 11  CREATININE 1.50*  CALCIUM 9.8   Liver Function Tests:  Recent Labs Lab 01/13/14 1300  AST 19  ALT 17  ALKPHOS 108   BILITOT 0.5  PROT 7.3  ALBUMIN 3.9   No results found for this basename: LIPASE, AMYLASE,  in the last 168 hours No results found for this basename: AMMONIA,  in the last 168 hours CBC:  Recent Labs Lab 01/13/14 1300  WBC 9.5  NEUTROABS 6.7  HGB 12.9*  HCT 38.6*  MCV 98.0  PLT 237   Cardiac Enzymes:  Recent Labs Lab 01/13/14 1300  TROPONINI <0.30   BNP (last 3 results) No results found for this basename: PROBNP,  in the last 8760 hours CBG: No results found for this basename: GLUCAP,  in the last 168 hours  No results found for this or any previous visit (from the past 240 hour(s)).   Studies: Dg Chest 2 View  01/13/2014   CLINICAL DATA:  Right facial droop and difficulty speaking. Question aspiration  EXAM: CHEST  2 VIEW  COMPARISON:  CT chest 07/13/2006.  PA and lateral chest 02/20/2005.  FINDINGS: The lungs demonstrates some emphysematous disease but are clear. Heart size is normal. No pneumothorax or pleural effusion. Cholecystectomy clips are noted. No focal bony abnormality.  IMPRESSION: No acute disease.   Electronically Signed   By: Inge Rise M.D.   On: 01/13/2014 15:30   Ct Head Wo Contrast  01/13/2014   CLINICAL DATA:  Sudden onset slurred speech. History of head neck cancer  EXAM: CT HEAD WITHOUT CONTRAST  TECHNIQUE: Contiguous axial images were obtained from the base of the skull through the vertex without intravenous contrast.  COMPARISON:  Head CT 12/10/2004  FINDINGS: No acute intracranial hemorrhage. No focal mass lesion. No CT evidence of acute infarction. No midline shift or mass effect. No hydrocephalus. Basilar cisterns are patent.  There is extensive white matter hypodensities in the left corona radiata as well as periventricular of white matter. Paranasal sinuses and mastoid air cells are clear.  IMPRESSION: 1. No acute intracranial findings. 2. No intracranial hemorrhage. 3. White matter infarction in the left corona radiata unchanged from prior.    Electronically Signed   By: Suzy Bouchard M.D.   On: 01/13/2014 13:05   Mr Brain Wo Contrast  01/13/2014   CLINICAL DATA:  Stroke symptoms with speech difficulty beginning 48-72 hr earlier. Right arm numbness. Imbalance. Right facial droop.  EXAM: MRI HEAD WITHOUT CONTRAST  TECHNIQUE: Multiplanar, multiecho pulse sequences of the brain and surrounding structures were obtained without intravenous contrast.  COMPARISON:  CT head 01/13/2014 earlier in the day.  FINDINGS: There is an acute deep white matter infarct in the left centrum semiovale 9 x 13 mm cross-section without significant mass effect or hemorrhage. No other areas of acute infarction are observed. There is no mass lesion or hydrocephalus, and no extra-axial fluid.  Premature for age cerebral and cerebellar atrophy. Moderately extensive T2 and FLAIR hyperintensities throughout the periventricular and subcortical white matter, also affecting the brainstem consistent with chronic microvascular ischemic change. Remote lacunar infarcts are present, most notably in the bilateral basal ganglia and right pons. Flow voids are maintained. Pituitary, pineal, and cerebellar tonsils unremarkable. No upper cervical lesions. Minor chronic sinus disease. Negative orbits and mastoids.  Compared with prior CT, the acute infarct is indistinguishable from areas of chronic ischemia.  IMPRESSION: Acute deep white matter infarct left centrum semiovale. No hemorrhage or mass effect.   Electronically Signed   By: Rolla Flatten M.D.   On: 01/13/2014 16:30    Scheduled Meds: . aspirin  300 mg Rectal Daily   Or  . aspirin  325 mg Oral Daily  . enoxaparin (LOVENOX) injection  40 mg Subcutaneous Q24H   Continuous Infusions: . sodium chloride 100 mL/hr at 01/13/14 1900    Principal Problem:   Acute ischemic stroke Active Problems:   PVD (peripheral vascular disease)   Other and unspecified hyperlipidemia   GERD (gastroesophageal reflux disease)   Squamous cell  carcinoma of soft palate    Time spent: [facility] Pager [phone]. If 7PM-7AM, please contact night-coverage at [email or web address], password [facility] 01/14/2014, 10:07 AM  LOS: 1 day

## 2014-01-14 NOTE — Evaluation (Addendum)
Clinical/Bedside Swallow Evaluation Patient Details  Name: [name] [name] MRN: [id number] Date of Birth: 09-05-1950  Today's Date: 01/14/2014 Time: 1300-1330 SLP Time Calculation (min): 30 min  Past Medical History:  Past Medical History  Diagnosis Date  . Hyperlipemia   . Cancer     throat, lymph node  . GERD (gastroesophageal reflux disease)   . PVD (peripheral vascular disease)    Past Surgical History:  Past Surgical History  Procedure Laterality Date  . Cholecystectomy    . Lymphadenectomy Right 2009    neck   HPI:  With h/o PVD, hyperlipidemia, SSCa soft palate presents to [facility] with slurred speech, right hand and arm numbness and weakness, drooling and unsteady gait.  His symptoms started 2 days ago, but he did not seek medical attention, because he "thought it might be allergies". No previous h/o stroke.  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    Assessment / Plan / Recommendation Clinical Impression  BSE completed per stroke protocol.  Min to moderate sensory oral dysphagia with suspected sensory pharyngeal dysphagia.  Mastication affected on right wtih noted pocketing right lateral sulci with no awareness of residue.  Slight wet vocal quality s/p cup sips in succession.  +s/s of suspected penetration of thin water administered by straw.  Recommend to proceed with dysphagia 2 (finely chopped) and thin liquids by cup sips only.  Due to s/s present and present weakness recommend completion of objective evaluation of MBS to assess risk for aspiration and determine safest, PO diet.  MBS to be completed 01/15/14.      Aspiration Risk  Moderate    Diet Recommendation Dysphagia 2 (Fine chop);Thin liquid   Liquid Administration via: Cup;No straw Medication Administration: Crushed with puree Supervision: Patient able to self feed;Intermittent supervision to cue for compensatory strategies Compensations: Slow rate;Small  sips/bites;Check for pocketing;Clear throat intermittently Postural Changes and/or Swallow Maneuvers: Out of bed for meals;Seated upright 90 degrees;Upright 30-60 min after meal    Other  Recommendations Recommended Consults: MBS Oral Care Recommendations: Oral care Q4 per protocol   Follow Up Recommendations   (TBD )    Frequency and Duration min 2x/week  2 weeks       SLP Swallow Goals Please refer to Care Plans for Listed Goals  Swallow Study Prior Functional Status   Lives at home with spouse.  No prior history of dysphagia     General Date of Onset: 01/13/14 HPI: With h/o PVD, hyperlipidemia, SSCa soft palate presents to [facility] with slurred speech, right hand and arm numbness and weakness, drooling and unsteady gait.  His symptoms started 2 days ago, but he did not seek medical attention, because he "thought it might be allergies".  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  He is on a statin and baby aspirin a day. Type of Study: Bedside swallow evaluation Diet Prior to this Study: NPO Temperature Spikes Noted: No Respiratory Status: Room air History of Recent Intubation: No Behavior/Cognition: Alert;Cooperative Oral Cavity - Dentition: Dentures, top;Dentures, bottom Self-Feeding Abilities: Able to feed self Patient Positioning:  (EOB ) Baseline Vocal Quality: Low vocal intensity;Hoarse Volitional Cough: Strong Volitional Swallow: Able to elicit    Oral/Motor/Sensory Function Overall Oral Motor/Sensory Function: Impaired Labial ROM: Reduced right Labial Symmetry: Abnormal symmetry right Labial Strength: Reduced Labial Sensation: Reduced Lingual ROM: Reduced right Lingual Symmetry: Abnormal symmetry right Lingual Strength: Reduced Lingual Sensation: Reduced  Facial ROM: Reduced right Facial Symmetry: Right droop Facial Strength: Reduced Facial Sensation: Reduced Velum: Impaired right Mandible: Impaired   Ice Chips  Ice chips: Impaired Oral Phase Impairments: Impaired mastication Pharyngeal Phase Impairments: Suspected delayed Swallow   Thin Liquid Thin Liquid: Impaired Presentation: Cup;Straw Pharyngeal  Phase Impairments: Cough - Delayed Other Comments: s/p swallow of thin by straw    Nectar Thick Nectar Thick Liquid: Not tested   Honey Thick Honey Thick Liquid: Not tested   Puree Puree: Impaired Oral Phase Impairments: Reduced lingual movement/coordination;Impaired anterior to posterior transit Oral Phase Functional Implications: Right anterior spillage Pharyngeal Phase Impairments: Suspected delayed Swallow   Solid   GO    Solid: Impaired Presentation: Self Fed Oral Phase Impairments: Poor awareness of bolus;Impaired mastication;Impaired anterior to posterior transit Oral Phase Functional Implications: Right lateral sulci pocketing;Oral residue Pharyngeal Phase Impairments: Suspected delayed Alba Destine [place], [place] [facility] 01/14/2014,2:45 PM

## 2014-01-14 NOTE — Evaluation (Signed)
Physical Therapy Evaluation Patient Details Name: [name] [name] MRN: [id number] DOB: 08-14-1950 Today's Date: 01/14/2014   History of Present Illness  DEMARIOUS KAPUR is a 63 y.o. male who was in his normal state of health up until 2 days ago at which point he developed right-sided weakness. He was lacking better so he sought medical care today.It is a static deficit since onset. He had an MRI done which shows a deep white matter infarct.  Clinical Impression   Pt admitted with deep white matter infarct. Pt currently with functional limitations due to the deficits listed below (see PT Problem List).  Pt will benefit from skilled PT to increase their independence and safety with mobility to allow discharge to the venue listed below.       Follow Up Recommendations Outpatient PT Speech and OT needs are greater than PT needs   Equipment Recommendations  Other (comment) (potentially single point cane)    Recommendations for Other Services       Precautions / Restrictions Precautions Precautions: Fall      Mobility  Bed Mobility                  Transfers Overall transfer level: Needs assistance Equipment used: None Transfers: Sit to/from Stand Sit to Stand: Supervision         General transfer comment: Aupervision for safety, but ingeneral steady  Ambulation/Gait Ambulation/Gait assistance: Supervision;Min guard Ambulation Distance (Feet): 200 Feet Assistive device: None (and pushing IV pole)       General Gait Details: Noted slightly erratic step width (no scissoring), indicative of decr stance stability; Alos noted slight tendency for R ankle to roll towards inversion; no gross loss of balance; pt indicated this is much better than when symptoms began 2 days ago  Financial trader Rankin (Stroke Patients Only) Modified Rankin (Stroke Patients Only) Pre-Morbid Rankin Score: No symptoms Modified Rankin: Slight  disability     Balance Overall balance assessment: Needs assistance   Sitting balance-Leahy Scale: Normal     Standing balance support: No upper extremity supported Standing balance-Leahy Scale: Good                   Standardized Balance Assessment Standardized Balance Assessment :  (worth considering a Berg or DGI next session)           Pertinent Vitals/Pain no apparent distress     Home Living Family/patient expects to be discharged to:: Private residence Living Arrangements: Spouse/significant other Available Help at Discharge: Family;Available PRN/intermittently Type of Home: House Home Access: Stairs to enter Entrance Stairs-Rails: None Entrance Stairs-Number of Steps: 2 Home Layout: One level Home Equipment: None      Prior Function Level of Independence: Independent               Hand Dominance   Dominant Hand:  (reports is ambidextrous)    Extremity/Trunk Assessment   Upper Extremity Assessment: Defer to OT evaluation (Noted decr RUE strength when grossly compared to Left)           Lower Extremity Assessment: Overall WFL for tasks assessed         Communication   Communication: Expressive difficulties  Cognition Arousal/Alertness: Awake/alert Behavior During Therapy: WFL for tasks assessed/performed (a bit emotionally labile) Overall Cognitive Status: Within Functional Limits for tasks assessed (very simple mobiltiy tasks)  General Comments General comments (skin integrity, edema, etc.): Pt had clearly reviewed stroke education materials and was able to state risk facotrs and signs and symptoms of CVA    Exercises        Assessment/Plan    PT Assessment Patient needs continued PT services  PT Diagnosis Abnormality of gait   PT Problem List Decreased balance;Decreased mobility;Decreased coordination;Decreased knowledge of use of DME  PT Treatment Interventions     PT Goals (Current goals  can be found in the Care Plan section) Acute Rehab PT Goals Patient Stated Goal: Home; and he would really like to eat PT Goal Formulation: With patient Time For Goal Achievement: 01/21/14 Potential to Achieve Goals: Good    Frequency Min 4X/week (may meet PT goals within 1-2 sessions)   Barriers to discharge        Co-evaluation               End of Session Equipment Utilized During Treatment: Gait belt Activity Tolerance: Patient tolerated treatment well Patient left: in chair;with call bell/phone within reach;with family/visitor present Nurse Communication: Mobility status         Time: 4917-9150 PT Time Calculation (min): 20 min   Charges:   PT Evaluation $Initial PT Evaluation Tier I: 1 Procedure PT Treatments $Gait Training: 8-22 mins   PT G Codes:          Quin Hoop 01/14/2014, 10:38 AM  Roney Marion, [place] Pager [phone] Office [phone]

## 2014-01-14 NOTE — Progress Notes (Signed)
Stroke Team Progress Note  HISTORY [name] [name] is a 63 y.o. male who was in his normal state of health up until 2 days prior to admission at which point he developed right-sided weakness. He was feeling better so he sought medical care today.  It is a static deficit since onset. He had an MRI done which shows a deep white matter infarct.  LKW: 7/9  tpa given?: no, outside of window    SUBJECTIVE The patient's wife was at the bedside. The patient has severe dysarthria. He is NPO and is getting hungry.  OBJECTIVE Most recent Vital Signs: Filed Vitals:   01/14/14 0200 01/14/14 0400 01/14/14 0600 01/14/14 0900  BP: 112/46 143/71 159/78 127/70  Pulse: 73 62 69 63  Temp: 98.1 F (36.7 C) 97.9 F (36.6 C) 98.1 F (36.7 C) 97.7 F (36.5 C)  TempSrc: Oral Oral Oral Oral  Resp: 18 18 18 20   Height:      Weight:      SpO2: 94% 94% 96% 97%   CBG (last 3)  No results found for this basename: GLUCAP,  in the last 72 hours  IV Fluid Intake:   . sodium chloride 100 mL/hr at 01/13/14 1900    MEDICATIONS  . aspirin  300 mg Rectal Daily   Or  . aspirin  325 mg Oral Daily  . enoxaparin (LOVENOX) injection  40 mg Subcutaneous Q24H   PRN:  acetaminophen, acetaminophen  Diet:    dysphagia 2 diet with thin liquids Activity:  Up with assistance DVT Prophylaxis:  Lovenox  CLINICALLY SIGNIFICANT STUDIES Basic Metabolic Panel:  Recent Labs Lab 01/13/14 1300  NA 137  K 3.7  CL 99  CO2 23  GLUCOSE 136*  BUN 11  CREATININE 1.50*  CALCIUM 9.8   Liver Function Tests:  Recent Labs Lab 01/13/14 1300  AST 19  ALT 17  ALKPHOS 108  BILITOT 0.5  PROT 7.3  ALBUMIN 3.9   CBC:  Recent Labs Lab 01/13/14 1300  WBC 9.5  NEUTROABS 6.7  HGB 12.9*  HCT 38.6*  MCV 98.0  PLT 237   Coagulation:  Recent Labs Lab 01/13/14 1300  LABPROT 13.2  INR 1.00   Cardiac Enzymes:  Recent Labs Lab 01/13/14 1300  TROPONINI <0.30   Urinalysis: No results found for this basename:  COLORURINE, APPERANCEUR, LABSPEC, PHURINE, GLUCOSEU, HGBUR, BILIRUBINUR, KETONESUR, PROTEINUR, UROBILINOGEN, NITRITE, LEUKOCYTESUR,  in the last 168 hours Lipid Panel    Component Value Date/Time   CHOL 122 01/14/2014 0722   TRIG 90 01/14/2014 0722   HDL 32* 01/14/2014 0722   CHOLHDL 3.8 01/14/2014 0722   VLDL 18 01/14/2014 0722   LDLCALC 72 01/14/2014 0722   HgbA1C  No results found for this basename: HGBA1C    Urine Drug Screen:   No results found for this basename: labopia, cocainscrnur, labbenz, amphetmu, thcu, labbarb    Alcohol Level: No results found for this basename: ETH,  in the last 168 hours  Dg Chest 2 View 01/13/2014    No acute disease.     Ct Head Wo Contrast 01/13/2014    1. No acute intracranial findings.  2. No intracranial hemorrhage.  3. White matter infarction in the left corona radiata unchanged from prior.      Mr Brain Wo Contrast 01/13/2014    Acute deep white matter infarct left centrum semiovale. No hemorrhage or mass effect.    Carotid Doppler  pending  2D Echocardiogram - ejection fraction 55-60%. No cardiac  source of embolism identified.  CXR    EKG  normal sinus rhythm rate 69 beats per minute. For complete results please see formal report.   Therapy Recommendations pending  Physical Exam   Mental Status:  Patient is awake, alert, oriented to person, place, month, year, and situation.  Immediate and remote memory are intact.  Patient is able to give a clear and coherent history.  No signs of aphasia or neglect  Cranial Nerves:  II: Visual Fields are full. Pupils are equal, round, and reactive to light. Discs are difficult to visualize.  III,IV, VI: EOMI without ptosis or diploplia.  V: Facial sensation is symmetric to temperature  VII: Facial movement is notable for mild right facial weakness  VIII: hearing is intact to voice  X: Uvula elevates symmetrically  XI: Shoulder shrug is symmetric.  XII: tongue deviates to the right mildly   Motor:  Tone is normal. Bulk is normal. 5/5 strength was present on the left, 4/5 in the right arm and leg.  Sensory:  Sensation is symmetric to light touch and temperature in the arms and legs.  Deep Tendon Reflexes:  2+ and symmetric in the biceps and patellae.  Cerebellar:  FNF and HKS are intact on the left, consistent with weakness on the right.  Gait: - Deferred    ASSESSMENT Mr. [name] [name] is a 63 y.o. male presenting with right hemiparesis and dysarthria. T-PA therapy was not initiated secondary to late presentation. MRI - Acute deep white matter infarct left centrum semiovale. On aspirin 81 mg orally every day prior to admission. Now on aspirin 325 mg orally every day for secondary stroke prevention. Patient with resultant dysarthria and mild right hemiparesis. Stroke work up underway.   Hyperlipidemia - cholesterol 122; pulse 72 - on Lipitor prior to admission  Mild renal insufficiency   Hospital day # 1  TREATMENT/PLAN  Continue aspirin 325 mg orally every day for secondary stroke prevention.  Await therapy evaluations  Await carotid Dopplers  Speech has cleared patient for dysphagia 2 diet  SIGNED Mikey Bussing PA-C [facility] Pager [phone] 01/14/2014, 9:59 AM   I, the attending vascular neurologist, have personally obtained a history, examined the patient, evaluated laboratory data and imaging studies, and formulated the assessment and plan of care.  I have made any additions or clarifications directly to the above note and agree with the findings and plan as currently documented. Appreciate speech evaluation.   Leotis Pain  To contact Stroke Continuity provider, please refer to [email or web address] After hours, contact General Neurology

## 2014-01-15 ENCOUNTER — Inpatient Hospital Stay (HOSPITAL_COMMUNITY): Payer: Medicare Other

## 2014-01-15 ENCOUNTER — Encounter (HOSPITAL_COMMUNITY): Payer: Self-pay | Admitting: Radiology

## 2014-01-15 LAB — BASIC METABOLIC PANEL
Anion gap: 11 (ref 5–15)
BUN: 11 mg/dL (ref 6–23)
CALCIUM: 8.9 mg/dL (ref 8.4–10.5)
CO2: 24 mEq/L (ref 19–32)
Chloride: 105 mEq/L (ref 96–112)
Creatinine, Ser: 1.4 mg/dL — ABNORMAL HIGH (ref 0.50–1.35)
GFR calc Af Amer: 59 mL/min — ABNORMAL LOW (ref >90)
GFR, EST NON AFRICAN AMERICAN: 51 mL/min — AB (ref >90)
Glucose, Bld: 84 mg/dL (ref 70–99)
POTASSIUM: 3.9 meq/L (ref 3.7–5.3)
SODIUM: 140 meq/L (ref 137–147)

## 2014-01-15 MED ORDER — IOHEXOL 350 MG/ML SOLN
50.0000 mL | Freq: Once | INTRAVENOUS | Status: AC | PRN
  Administered 2014-01-15: 50 mL via INTRAVENOUS

## 2014-01-15 NOTE — Progress Notes (Signed)
Stroke Team Progress Note  HISTORY [name] [name] is a 63 y.o. male who was in his normal state of health up until 2 days prior to admission at which point he developed right-sided weakness. He was feeling better so he sought medical care today.  It is a static deficit since onset. He had an MRI done which shows a deep white matter infarct.  LKW: 7/9  tpa given?: no, outside of window  SUBJECTIVE No acute events overnight. Family is at the bedside. The patient is alert and conversant, and denies complaints.  OBJECTIVE Most recent Vital Signs: Filed Vitals:   01/14/14 1816 01/14/14 2059 01/14/14 2352 01/15/14 0432  BP: 143/63 163/82 145/70 135/70  Pulse: 73 72 61 78  Temp: 97.8 F (36.6 C) 97.6 F (36.4 C) 97.4 F (36.3 C) 98 F (36.7 C)  TempSrc: Oral Oral Oral Oral  Resp: 20 18 16    Height:      Weight:      SpO2: 99% 98% 97% 99%   CBG (last 3)  No results found for this basename: GLUCAP,  in the last 72 hours  IV Fluid Intake:   . sodium chloride 100 mL/hr at 01/14/14 2350    MEDICATIONS  . aspirin  300 mg Rectal Daily   Or  . aspirin  325 mg Oral Daily  . atorvastatin  20 mg Oral q1800  . enoxaparin (LOVENOX) injection  40 mg Subcutaneous Q24H  . pantoprazole  40 mg Oral Daily   PRN:  acetaminophen, acetaminophen  Diet:  Dysphagia dysphagia 2 diet with thin liquids Activity:  Up with assistance DVT Prophylaxis:  Lovenox  CLINICALLY SIGNIFICANT STUDIES Basic Metabolic Panel:   Recent Labs Lab 01/13/14 1300 01/15/14 0653  NA 137 140  K 3.7 3.9  CL 99 105  CO2 23 24  GLUCOSE 136* 84  BUN 11 11  CREATININE 1.50* 1.40*  CALCIUM 9.8 8.9   Liver Function Tests:   Recent Labs Lab 01/13/14 1300  AST 19  ALT 17  ALKPHOS 108  BILITOT 0.5  PROT 7.3  ALBUMIN 3.9   CBC:   Recent Labs Lab 01/13/14 1300  WBC 9.5  NEUTROABS 6.7  HGB 12.9*  HCT 38.6*  MCV 98.0  PLT 237   Coagulation:   Recent Labs Lab 01/13/14 1300  LABPROT 13.2  INR 1.00    Cardiac Enzymes:   Recent Labs Lab 01/13/14 1300  TROPONINI <0.30   Urinalysis: No results found for this basename: COLORURINE, APPERANCEUR, LABSPEC, PHURINE, GLUCOSEU, HGBUR, BILIRUBINUR, KETONESUR, PROTEINUR, UROBILINOGEN, NITRITE, LEUKOCYTESUR,  in the last 168 hours Lipid Panel    Component Value Date/Time   CHOL 122 01/14/2014 0722   TRIG 90 01/14/2014 0722   HDL 32* 01/14/2014 0722   CHOLHDL 3.8 01/14/2014 0722   VLDL 18 01/14/2014 0722   LDLCALC 72 01/14/2014 0722   HgbA1C  Lab Results  Component Value Date   HGBA1C 5.4 01/14/2014    Urine Drug Screen:   No results found for this basename: labopia,  cocainscrnur,  labbenz,  amphetmu,  thcu,  labbarb    Alcohol Level: No results found for this basename: ETH,  in the last 168 hours  Dg Chest 2 View 01/13/2014    No acute disease.     Ct Head Wo Contrast 01/13/2014    1. No acute intracranial findings.  2. No intracranial hemorrhage.  3. White matter infarction in the left corona radiata unchanged from prior.      Mr  Brain Wo Contrast 01/13/2014    Acute deep white matter infarct left centrum semiovale. No hemorrhage or mass effect.    Carotid Doppler  1-39% right internal carotid artery stenosis, elevated velocities suggest borderline 60-79% left internal stenosis   2D Echocardiogram - ejection fraction 55-60%. No cardiac source of embolism identified.   EKG  normal sinus rhythm rate 69 beats per minute. For complete results please see formal report.   Therapy Recommendations Speech recs OP SLT, no need for ongoing OT recommended  Physical Exam   Blood pressure 134/73, pulse 67, temperature 97.4 F (36.3 C), temperature source Oral, resp. rate 16, height 5\' 4"  (1.626 m), weight 73.936 kg (163 lb), SpO2 98.00%.  Mental Status:  Patient is awake, alert, oriented to person, place, month, year, and situation.  Immediate and remote memory are intact.  Patient is able to give a clear and coherent history.   Speech slurred but patient able to name and repeat.  Cranial Nerves:  II: Visual Fields are full. Pupils are equal, round, and reactive to light.  III,IV, VI: EOMI without ptosis or diploplia.  V: Facial sensation is symmetric to temperature  VII: Facial movement is notable for very mild right facial weakness  VIII: hearing is intact to voice  X: Uvula elevates symmetrically  XI: Shoulder shrug is symmetric.  XII: tongue midline  Motor:  5/5 LUE and bilat LEs. 4+/5 RUE.  Sensory:  Sensation is symmetric to light touch and temperature in the arms and legs. Diminished to vibration in RUE and RLE.  Deep Tendon Reflexes:  Did not assess Cerebellar:  No ataxia or dysmetria out of proportion to weakness Gait: - Deferred    ASSESSMENT Mr. [name] [name] is a 63 y.o. male presenting with right hemiparesis and dysarthria. T-PA therapy was not initiated secondary to late presentation. 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 On aspirin 81 mg orally every day prior to admission. Now on aspirin 325 mg orally every day for secondary stroke prevention. Patient with resultant dysarthria and mild right hemiparesis. Stroke work up underway.   Hyperlipidemia - cholesterol 122; LDL 72 - on Lipitor prior to admission  Mild renal insufficiency   Hospital day # 2  TREATMENT/PLAN  Continue aspirin 325 mg orally every day for secondary stroke prevention.  Speech recs OP SLT, OT recs no follow up necesssary  2D Echo shows no definite cardiac source of emboli  Carotid Dopplers L ICA 60-79% stenosis  CTA neck, if >60% stenosis, vascular surgery consult, if <60%, medical therapy  Speech has cleared patient for dysphagia 2 diet, thin liquids  LDL 72, continue Lipitor 20 mg daily  HgbA1c 5.4, WNL  Continue ASA and Lovenox  D/W Dr Inis Sizer  SIGNED Delbert Phenix, MSN, ANP-C, CNRN, MSCS [facility] Stroke Team [phone]   01/15/2014, 7:57 AM   I, the attending vascular neurologist, have personally obtained a history, examined the patient, evaluated laboratory data and imaging studies, and formulated the assessment and plan of care.  I have made any additions or clarifications directly to the above note and agree with the findings and plan as currently documented. Appreciate speech evaluation.  Antony Contras, MD   To contact Stroke Continuity provider, please refer to [email or web address] After hours, contact General Neurology

## 2014-01-15 NOTE — Progress Notes (Signed)
Speech Language Pathology Treatment: Dysphagia  Patient Details Name: [name] [name] MRN: [id number] DOB: 12-Jul-1950 Today's Date: 01/15/2014 Time: 9833-8250 SLP Time Calculation (min): 10 min  Assessment / Plan / Recommendation Clinical Impression  Patient consumed Dys 2 textures and thin liquids via cup sips with improvement noted from SLP note 7/12. Pt presented with a mildly wet vocal quality at baseline, which he cleared reflexively with a strong throat clear. Pt's vocal quality remained unchanged throughout PO intake, with one delayed cough noted after a straw sip of thin liquid. Pt verbalized his swallowing strategies and utilized them throughout intake with one verbal reminder.   HPI HPI: With h/o PVD, hyperlipidemia, SSCa soft palate presents to [facility] with slurred speech, right hand and arm numbness and weakness, drooling and unsteady gait.  His symptoms started 2 days ago, but he did not seek medical attention, because he "thought it might be allergies". No previous h/o stroke.  Denies chest pain or palpitations. He has noted some coughing with liquids. He has continued to eat and take his pills.  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  He is on a statin and baby aspirin a day.   Pertinent Vitals n/a  SLP Plan  Continue with current plan of care    Recommendations Diet recommendations: Dysphagia 2 (fine chop);Thin liquid Liquids provided via: Cup;No straw Medication Administration: Crushed with puree Supervision: Patient able to self feed;Intermittent supervision to cue for compensatory strategies Compensations: Slow rate;Small sips/bites;Check for pocketing;Clear throat intermittently Postural Changes and/or Swallow Maneuvers: Out of bed for meals;Seated upright 90 degrees;Upright 30-60 min after meal              Oral Care Recommendations: Oral care BID Follow up Recommendations:  Outpatient SLP Plan: Continue with current plan of care    GO      Germain Osgood, M.A. CCC-SLP [phone]  Germain Osgood 01/15/2014, 11:35 AM

## 2014-01-15 NOTE — Progress Notes (Signed)
Physical Therapy Treatment Patient Details Name: [name] [name] MRN: [id number] DOB: 1951/03/17 Today's Date: 01/15/2014    History of Present Illness [name] [name] is a 62 y.o. male who was in his normal state of health up until 2 days ago at which point he developed right-sided weakness. He was lacking better so he sought medical care today.It is a static deficit since onset. He had an MRI done which shows a deep white matter infarct.    PT Comments    Pt progressing towards all goals but con't to have impaired higher balance deficits and remains at increased falls risk as demo'd by score of 16 on DGI and score of 41 on Berg. Pt to con't to benefit from outpt PT upon d/c to address mentioned deficits. PT to con't to follow.  Follow Up Recommendations  Outpatient PT;Supervision - Intermittent     Equipment Recommendations       Recommendations for Other Services       Precautions / Restrictions Precautions Precautions: Fall Restrictions Weight Bearing Restrictions: No    Mobility  Bed Mobility               General bed mobility comments: pt up in recliner  Transfers Overall transfer level: Needs assistance Equipment used: None Transfers: Sit to/from Stand Sit to Stand: Supervision            Ambulation/Gait Ambulation/Gait assistance: Supervision Ambulation Distance (Feet): 600 Feet Assistive device: None Gait Pattern/deviations: Decreased step length - left;Decreased stance time - left     General Gait Details: pt with mild L LE circumduction, supination/eversion of L foot and minimal L knee flexion during swing phase however compensates well and is steady   Stairs Stairs: Yes Stairs assistance: Min guard Stair Management: One rail Right;Alternating pattern Number of Stairs: 12 General stair comments: min guard for stability  Wheelchair Mobility    Modified Rankin (Stroke Patients Only) Modified Rankin (Stroke Patients Only) Pre-Morbid Rankin  Score: No symptoms Modified Rankin: Slight disability     Balance Overall balance assessment: Needs assistance               Single Leg Stance - Right Leg:  (9 sec) Single Leg Stance - Left Leg:  (unable) Tandem Stance - Right Leg:  (tandem amb with min/modA x 10 feet)       High level balance activites: Side stepping;Backward walking;Head turns High Level Balance Comments: Pt with most difficulty with tandem amb    Cognition Arousal/Alertness: Awake/alert Behavior During Therapy: WFL for tasks assessed/performed Overall Cognitive Status: Within Functional Limits for tasks assessed                      Exercises Other Exercises Other Exercises: Gave pt fine motor handout and went over exercises with him; also gave him theraputty handout and went over this with him as well as well as issued him some red putty. Pt is to do all 10 exercises throughout each day and as one starts to become easy he can stop doing that one and just do the others.    General Comments        Pertinent Vitals/Pain Reports of L hip pain but has had that for years    Home Living Family/patient expects to be discharged to:: Private residence Living Arrangements: Spouse/significant other Available Help at Discharge: Family;Available PRN/intermittently Type of Home: House Home Access: Stairs to enter Entrance Stairs-Rails: None Home Layout: One level Home Equipment: Civil engineer, contracting (  no back)      Prior Function Level of Independence: Independent          PT Goals (current goals can now be found in the care plan section) Acute Rehab PT Goals Patient Stated Goal: hopeful for home today Progress towards PT goals: Progressing toward goals    Frequency  Min 3X/week    PT Plan Current plan remains appropriate;Frequency needs to be updated    Co-evaluation             End of Session Equipment Utilized During Treatment: Gait belt Activity Tolerance: Patient tolerated treatment  well Patient left: in chair;with call bell/phone within reach;with family/visitor present     Time: 1440-1511 PT Time Calculation (min): 31 min  Charges:  $Gait Training: 23-37 mins                    G Codes:      Kingsley Callander 01/15/2014, 3:20 PM  Kittie Plater, PT, DPT Pager #: [phone] Office #: [phone]

## 2014-01-15 NOTE — Evaluation (Signed)
Occupational Therapy Evaluation and Discharge Patient Details Name: [name] [name] MRN: [id number] DOB: [birth year] Today's Date: 01/15/2014    History of Present Illness [name] [name] is a 63 y.o. male who was in his normal state of health up until 2 days ago at which point he developed right-sided weakness. He was lacking better so he sought medical care today.It is a static deficit since onset. He had an MRI done which shows a deep white matter infarct.   Clinical Impression   This 63 yo male admitted for above presents to acute OT at a S/min guard A level. All education completed with pt with handouts given and pt return demonstrate. Feel he will not need follow up for RUE, but have advised him and wife that if he feels he does as he starts OPSLP and OPPT that he can let the therapists there know.    Follow Up Recommendations  No OT follow up    Equipment Recommendations  None recommended by OT       Precautions / Restrictions Precautions Precautions: Fall Restrictions Weight Bearing Restrictions: No      Mobility Bed Mobility               General bed mobility comments: Pt up in recliner upon arrival  Transfers Overall transfer level: Needs assistance Equipment used:  (none) Transfers: Sit to/from Stand Sit to Stand: Supervision                         Vision Eye Alignment: Within Functional Limits   Ocular Range of Motion: Within Functional Limits Tracking/Visual Pursuits: Able to track stimulus in all quads without difficulty Saccades: Additional eye shifts occurred during testing (Advised pt to not drive until MD clears him to)                  Pertinent Vitals/Pain No c/o pain     Hand Dominance  (ambidextrous)   Extremity/Trunk Assessment Upper Extremity Assessment Upper Extremity Assessment: RUE deficits/detail RUE Deficits / Details: Mild decreased coordination; strength WNL RUE Sensation: decreased proprioception (for finger to  nose, fine for finger opposition)           Communication Communication Communication: Expressive difficulties   Cognition Arousal/Alertness: Awake/alert Behavior During Therapy: WFL for tasks assessed/performed (laughs alot at things that are not necessairly funny) Overall Cognitive Status: Within Functional Limits for tasks assessed                        xercises   Other Exercises Other Exercises: Gave pt fine motor handout and went over exercises with him; also gave him theraputty handout and went over this with him as well as well as issued him some red putty. Pt is to do all 10 exercises throughout each day and as one starts to become easy he can stop doing that one and just do the others.        Home Living Family/patient expects to be discharged to:: Private residence Living Arrangements: Spouse/significant other Available Help at Discharge: Family;Available PRN/intermittently Type of Home: House Home Access: Stairs to enter [employer] of Steps: 2 Entrance Stairs-Rails: None Home Layout: One level     Bathroom Shower/Tub: Tub/shower unit;Curtain Shower/tub characteristics: Architectural technologist: Standard     Home Equipment: Shower seat (no back)      Lives With: Spouse    Prior Functioning/Environment Level of Independence: Independent  OT Goals(Current goals can be found in the care plan section) Acute Rehab OT Goals Patient Stated Goal: hopeful for home today  OT Frequency:                End of Session Equipment Utilized During Treatment:  (None)  Activity Tolerance: Patient tolerated treatment well Patient left: in chair;with chair alarm set   Time: 9381-0175 OT Time Calculation (min): 49 min Charges:  OT General Charges $OT Visit: 1 Procedure OT Evaluation $Initial OT Evaluation Tier I: 1 Procedure OT Treatments $Self Care/Home Management : 23-37 mins $Therapeutic Exercise: 8-22  mins  [name] [name] [phone] 01/15/2014, 2:23 PM

## 2014-01-15 NOTE — Progress Notes (Signed)
SLP Cancellation Note  Patient Details Name: [name] [name] MRN: [id number] DOB: 1951-03-27   Cancelled treatment:       Reason Eval/Treat Not Completed: Patient at procedure or test/unavailable. SLP will return as schedule allows for cognitive-linguistic evaluation and f/u for diet tolerance versus need for objective testing.    Germain Osgood, M.A. CCC-SLP [phone]  Germain Osgood 01/15/2014, 8:51 AM

## 2014-01-15 NOTE — Progress Notes (Signed)
*  PRELIMINARY RESULTS* Vascular Ultrasound Carotid Duplex (Doppler) has been completed.  Findings suggest 1-39% right internal carotid artery stenosis and borderline 40-59% versus 60-79% left internal carotid artery stenosis. Vertebral arteries are patent with antegrade flow.  01/15/2014 9:18 AM Maudry Mayhew, RVT, RDCS, RDMS

## 2014-01-15 NOTE — Evaluation (Addendum)
Speech Language Pathology Evaluation Patient Details Name: [name] [name] MRN: [id number] DOB: 1950-11-27 Today's Date: 01/15/2014 Time: 6734-1937 SLP Time Calculation (min): 21 min  Problem List:  Patient Active Problem List   Diagnosis Date Noted  . Right sided weakness 01/13/2014  . Acute ischemic stroke 01/13/2014  . PVD (peripheral vascular disease) 01/13/2014  . Other and unspecified hyperlipidemia 01/13/2014  . GERD (gastroesophageal reflux disease) 01/13/2014  . 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 Hyperlipemia   . Cancer     throat, lymph node  . GERD (gastroesophageal reflux disease)   . PVD (peripheral vascular disease)    Past Surgical History:  Past Surgical History  Procedure Laterality Date  . Cholecystectomy    . Lymphadenectomy Right 2009    neck   HPI:  With h/o PVD, hyperlipidemia, SSCa soft palate presents to [facility] with slurred speech, right hand and arm numbness and weakness, drooling and unsteady gait.  His symptoms started 2 days ago, but he did not seek medical attention, because he "thought it might be allergies". No previous h/o stroke.  Denies chest pain or palpitations. He has noted some coughing with liquids. He has continued to eat and take his pills.  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  He is on a statin and baby aspirin a day.   Assessment / Plan / Recommendation Clinical Impression  Pt demonstrates a moderate dysarthria characterized by imprecise articulation and hypernasality. SLP provided education and demonstration of speech intelligibility strategies, and pt returned demonstration at the phrase level with minimal cueing. Cognitive-linguistic skills appear to be at baseline, and patient and wife are both without subjective complaints. Pt will benefit from acute SLP services  to maximize functional communication as well as OP SLP f/u upon discharge.    SLP Assessment  Patient needs continued Speech Lanaguage Pathology Services    Follow Up Recommendations  Outpatient SLP    Frequency and Duration min 2x/week  1 week   Pertinent Vitals/Pain n/a   SLP Goals  SLP Goals Potential to Achieve Goals: Good  SLP Evaluation Prior Functioning  Cognitive/Linguistic Baseline: Within functional limits (pt and wife report that he is at his baseline) Type of Home: House  Lives With: Spouse Available Help at Discharge: Family;Available PRN/intermittently Vocation: Retired   Associate Professor  Overall Cognitive Status: Within Functional Limits for tasks assessed (appears [facility] for basic and mildly complex tasks)    Comprehension  Auditory Comprehension Overall Auditory Comprehension: Appears within functional limits for tasks assessed    Expression Expression Primary Mode of Expression: Verbal Verbal Expression Overall Verbal Expression: Appears within functional limits for tasks assessed   Oral / Motor Oral Motor/Sensory Function Overall Oral Motor/Sensory Function: Impaired Labial ROM: Reduced right Labial Symmetry: Abnormal symmetry right Labial Strength: Reduced Labial Sensation: Reduced Lingual ROM: Reduced right Lingual Symmetry: Abnormal symmetry right Lingual Strength: Reduced Lingual Sensation: Reduced Facial ROM: Reduced right Facial Symmetry: Right droop Facial Strength: Reduced Facial Sensation: Reduced Velum: Impaired right Mandible: Impaired Motor Speech Overall Motor Speech: Impaired Respiration: Within functional limits Phonation: Other (comment) (gravelly, mild rough quality) Resonance: Hypernasality Articulation: Impaired Level of Impairment: Sentence Intelligibility: Intelligibility reduced Word: 75-100% accurate Phrase: 75-100% accurate Sentence: 75-100% accurate Conversation: 75-100% accurate Motor Planning: Witnin functional  limits Motor Speech Errors: Not applicable Interfering Components: Hearing loss Effective Techniques:  Slow rate;Over-articulate;Pause   GO      Germain Osgood, M.A. CCC-SLP [phone]  Germain Osgood 01/15/2014, 12:23 PM

## 2014-01-15 NOTE — Progress Notes (Signed)
[facility] PROGRESS NOTE  [name] [name] MAU:[id number] DOB: 03-Nov-1950 DOA: 01/13/2014 PCP: No primary provider on file.  Assessment/Plan: Principal Problem:  Acute ischemic stroke: Not a TPA candidate due to delay in presentation.  -MRI with Acute deep white matter infarct left centrum semiovale echocardiogram- LVEF 55-60%, moderate LVH, normal LA size, diastolic dysfunction. -carotid Dopplers with L. ICA stenosis see below, fasting lipids- Tchol 122, LDL 72, hemoglobin A1c 5.4.  -physical therapy, occupational therapy- outpt therapy, speech therapy, -Aspirin therapy for  Secondary prevention L. 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   Carotid doppler- pending Findings suggest 1-39% right internal carotid artery stenosis and borderline 40-59% versus 60-79% left internal carotid artery stenosis. Vertebral arteries are patent with antegrade flow.  Antibiotics:  none  HPI/Subjective: States dysarthria slightly better. numbness, weakness better  Objective: Filed Vitals:   01/15/14 1015  BP: 134/73  Pulse: 67  Temp: 97.4 F (36.3 C)  Resp: 16    Intake/Output Summary (Last 24 hours) at 01/15/14 1021 Last data filed at 01/15/14 0400  Gross per 24 hour  Intake    120 ml  Output      0 ml  Net     120 ml   Filed Weights   01/13/14 1250  Weight: 73.936 kg (163 lb)    Exam:  General: alert & oriented x 3 In NAD, dysarthric Cardiovascular: RRR, nl S1 s2 Respiratory: CTAB Abdomen: soft +BS NT/ND, no masses palpable Extremities: No cyanosis and no edema Neuro: RUE 4/5 strength, rest of extremities 5/5     Data Reviewed: Basic Metabolic Panel:  Recent Labs Lab 01/13/14 1300 01/15/14 0653  NA 137 140  K 3.7 3.9  CL 99 105  CO2 23 24  GLUCOSE 136* 84  BUN 11 11  CREATININE 1.50* 1.40*  CALCIUM 9.8 8.9   Liver Function Tests:  Recent Labs Lab 01/13/14 1300  AST 19  ALT 17  ALKPHOS 108  BILITOT 0.5  PROT 7.3  ALBUMIN 3.9   No results found for this basename: LIPASE, AMYLASE,  in the last 168 hours No results found for this basename: AMMONIA,  in the last 168 hours CBC:  Recent Labs Lab 01/13/14 1300  WBC 9.5  NEUTROABS 6.7  HGB 12.9*  HCT 38.6*  MCV 98.0  PLT 237   Cardiac Enzymes:  Recent Labs Lab 01/13/14 1300  TROPONINI <0.30   BNP (last 3 results) No results found for this basename: PROBNP,  in the last 8760 hours CBG: No results found for this basename: GLUCAP,  in the last 168 hours  No results found for this or any previous visit (from the past 240 hour(s)).   Studies: Dg Chest 2 View  01/13/2014   CLINICAL DATA:  Right facial droop and difficulty speaking. Question aspiration  EXAM: CHEST  2 VIEW  COMPARISON:  CT chest 07/13/2006.  PA and lateral chest 02/20/2005.  FINDINGS:  The lungs demonstrates some emphysematous disease but are clear. Heart size is normal. No pneumothorax or pleural effusion. Cholecystectomy clips are noted. No focal bony abnormality.  IMPRESSION: No acute disease.   Electronically Signed   By: Inge Rise M.D.   On: 01/13/2014 15:30   Ct Head Wo Contrast  01/13/2014   CLINICAL DATA:  Sudden onset slurred speech. History of head neck cancer  EXAM: CT HEAD WITHOUT CONTRAST  TECHNIQUE: Contiguous axial images  were obtained from the base of the skull through the vertex without intravenous contrast.  COMPARISON:  Head CT 12/10/2004  FINDINGS: No acute intracranial hemorrhage. No focal mass lesion. No CT evidence of acute infarction. No midline shift or mass effect. No hydrocephalus. Basilar cisterns are patent.  There is extensive white matter hypodensities in the left corona radiata as well as periventricular of white matter. Paranasal sinuses and mastoid air cells are clear.  IMPRESSION: 1. No acute intracranial findings. 2. No intracranial hemorrhage. 3. White matter infarction in the left corona radiata unchanged from prior.   Electronically Signed   By: Suzy Bouchard M.D.   On: 01/13/2014 13:05   Mr Brain Wo Contrast  01/13/2014   CLINICAL DATA:  Stroke symptoms with speech difficulty beginning 48-72 hr earlier. Right arm numbness. Imbalance. Right facial droop.  EXAM: MRI HEAD WITHOUT CONTRAST  TECHNIQUE: Multiplanar, multiecho pulse sequences of the brain and surrounding structures were obtained without intravenous contrast.  COMPARISON:  CT head 01/13/2014 earlier in the day.  FINDINGS: There is an acute deep white matter infarct in the left centrum semiovale 9 x 13 mm cross-section without significant mass effect or hemorrhage. No other areas of acute infarction are observed. There is no mass lesion or hydrocephalus, and no extra-axial fluid.  Premature for age cerebral and cerebellar atrophy. Moderately extensive T2 and FLAIR hyperintensities throughout the periventricular and subcortical white matter, also affecting the brainstem consistent with chronic microvascular ischemic change. Remote lacunar infarcts are present, most notably in the bilateral basal ganglia and right pons. Flow voids are maintained. Pituitary, pineal, and cerebellar tonsils unremarkable. No upper cervical lesions. Minor chronic sinus disease. Negative orbits and mastoids.  Compared with prior CT, the acute infarct is  indistinguishable from areas of chronic ischemia.  IMPRESSION: Acute deep white matter infarct left centrum semiovale. No hemorrhage or mass effect.   Electronically Signed   By: Rolla Flatten M.D.   On: 01/13/2014 16:30    Scheduled Meds: . aspirin  300 mg Rectal Daily   Or  . aspirin  325 mg Oral Daily  . atorvastatin  20 mg Oral q1800  . enoxaparin (LOVENOX) injection  40 mg Subcutaneous Q24H  . pantoprazole  40 mg Oral Daily   Continuous Infusions: . sodium chloride 100 mL/hr at 01/14/14 2350    Principal Problem:   Acute ischemic stroke Active Problems:   PVD (peripheral vascular disease)   Other and unspecified hyperlipidemia   GERD (gastroesophageal reflux disease)   Squamous cell carcinoma of soft palate    Time spent: [facility] Pager [phone]. If 7PM-7AM, please contact night-coverage at [email or web address], password [facility] 01/15/2014, 10:21 AM  LOS: 2 days

## 2014-01-16 DIAGNOSIS — I6529 Occlusion and stenosis of unspecified carotid artery: Secondary | ICD-10-CM

## 2014-01-16 MED ORDER — ASPIRIN 325 MG PO TABS: 325.0000 mg | ORAL_TABLET | Freq: Every day | ORAL | Status: DC

## 2014-01-16 MED ORDER — CLOPIDOGREL BISULFATE 75 MG PO TABS: 75.0000 mg | ORAL_TABLET | Freq: Every day | ORAL | Status: DC

## 2014-01-16 NOTE — Progress Notes (Signed)
Stroke Team Progress Note  HISTORY [name] [name] is a 63 y.o. male who was in his normal state of health up until 2 days prior to admission at which point he developed right-sided weakness. He was feeling better so he sought medical care today.  It is a static deficit since onset. He had an MRI done which shows a deep white matter infarct.  LKW: 7/9  tpa given?: no, outside of window  SUBJECTIVE No acute events overnight. Family is at the bedside. The patient is alert and conversant, and denies complaints.  OBJECTIVE Most recent Vital Signs: Filed Vitals:   01/15/14 1730 01/15/14 2100 01/16/14 0027 01/16/14 0621  BP: 155/65 141/65 126/70 136/78  Pulse: 65 61 75 65  Temp: 97.6 F (36.4 C) 98.1 F (36.7 C) 98.1 F (36.7 C) 97.9 F (36.6 C)  TempSrc: Oral Oral Oral Oral  Resp: 18 18 18 18   Height:      Weight:      SpO2: 100% 100% 100% 95%   CBG (last 3)  No results found for this basename: GLUCAP,  in the last 72 hours  IV Fluid Intake:      MEDICATIONS  . aspirin  300 mg Rectal Daily   Or  . aspirin  325 mg Oral Daily  . atorvastatin  20 mg Oral q1800  . enoxaparin (LOVENOX) injection  40 mg Subcutaneous Q24H  . pantoprazole  40 mg Oral Daily   PRN:  acetaminophen, acetaminophen  Diet:  Dysphagia dysphagia 2 diet with thin liquids Activity:  Up with assistance DVT Prophylaxis:  Lovenox  CLINICALLY SIGNIFICANT STUDIES Basic Metabolic Panel:   Recent Labs Lab 01/13/14 1300 01/15/14 0653  NA 137 140  K 3.7 3.9  CL 99 105  CO2 23 24  GLUCOSE 136* 84  BUN 11 11  CREATININE 1.50* 1.40*  CALCIUM 9.8 8.9   Liver Function Tests:   Recent Labs Lab 01/13/14 1300  AST 19  ALT 17  ALKPHOS 108  BILITOT 0.5  PROT 7.3  ALBUMIN 3.9   CBC:   Recent Labs Lab 01/13/14 1300  WBC 9.5  NEUTROABS 6.7  HGB 12.9*  HCT 38.6*  MCV 98.0  PLT 237   Coagulation:   Recent Labs Lab 01/13/14 1300  LABPROT 13.2  INR 1.00   Cardiac Enzymes:   Recent Labs Lab  01/13/14 1300  TROPONINI <0.30   Urinalysis: No results found for this basename: COLORURINE, APPERANCEUR, LABSPEC, PHURINE, GLUCOSEU, HGBUR, BILIRUBINUR, KETONESUR, PROTEINUR, UROBILINOGEN, NITRITE, LEUKOCYTESUR,  in the last 168 hours Lipid Panel    Component Value Date/Time   CHOL 122 01/14/2014 0722   TRIG 90 01/14/2014 0722   HDL 32* 01/14/2014 0722   CHOLHDL 3.8 01/14/2014 0722   VLDL 18 01/14/2014 0722   LDLCALC 72 01/14/2014 0722   HgbA1C  Lab Results  Component Value Date   HGBA1C 5.4 01/14/2014    Urine Drug Screen:   No results found for this basename: labopia,  cocainscrnur,  labbenz,  amphetmu,  thcu,  labbarb    Alcohol Level: No results found for this basename: ETH,  in the last 168 hours  Dg Chest 2 View 01/13/2014    No acute disease.     Ct Head Wo Contrast 01/13/2014    1. No acute intracranial findings.  2. No intracranial hemorrhage.  3. White matter infarction in the left corona radiata unchanged from prior.      Mr Brain Wo Contrast 01/13/2014    Acute  deep white matter infarct left centrum semiovale. No hemorrhage or mass effect.    Carotid Doppler  1-39% right internal carotid artery stenosis, elevated velocities suggest borderline 60-79% left internal stenosis   2D Echocardiogram - ejection fraction 55-60%. No cardiac source of embolism identified.   EKG  normal sinus rhythm rate 69 beats per minute. For complete results please see formal report.   Therapy Recommendations Speech recs OP SLT, no need for ongoing OT recommended  Physical Exam   Blood pressure 136/78, pulse 65, temperature 97.9 F (36.6 C), temperature source Oral, resp. rate 18, height 5\' 4"  (1.626 m), weight 73.936 kg (163 lb), SpO2 95.00%.  Mental Status:  Patient is awake, alert, oriented to person, place, month, year, and situation.  Immediate and remote memory are intact.  Patient is able to give a clear and coherent history.  Speech slurred but patient able to name and  repeat.  Cranial Nerves:  II: Visual Fields are full. Pupils are equal, round, and reactive to light.  III,IV, VI: EOMI without ptosis or diploplia.  V: Facial sensation is symmetric to temperature  VII: Facial movement is notable for very mild right facial weakness  VIII: hearing is intact to voice  X: Uvula elevates symmetrically  XI: Shoulder shrug is symmetric.  XII: tongue midline  Motor:  5/5 LUE and bilat LEs. 4+/5 RUE.  Sensory:  Sensation is symmetric to light touch and temperature in the arms and legs. Diminished to vibration in RUE and RLE.  Deep Tendon Reflexes:  Did not assess Cerebellar:  No ataxia or dysmetria out of proportion to weakness Gait: - Deferred    ASSESSMENT Mr. [name] [name] is a 63 y.o. male presenting with right hemiparesis and dysarthria. T-PA therapy was not initiated secondary to late presentation. 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 On aspirin 81 mg orally every day prior to admission. Now on aspirin 325 mg orally every day for secondary stroke prevention. Patient with resultant dysarthria and mild right hemiparesis. Stroke work up underway.   Hyperlipidemia - cholesterol 122; LDL 72 - on Lipitor prior to admission  Mild renal insufficiency   Hospital day # 3  TREATMENT/PLAN  Continue aspirin 325 mg orally every day for secondary stroke prevention.  Speech recs OP SLT, OT recs no follow up necesssary  2D Echo shows no definite cardiac source of emboli  Carotid Dopplers L ICA 60-79% stenosis  Per Dr. 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 Will continue medical management for now and repeat Carotid Ultrasound in 3 months.   Speech has cleared patient for dysphagia 2 diet, thin liquids  LDL 72, continue Lipitor 20 mg daily  HgbA1c 5.4, WNL  Continue ASA and Lovenox  D/W  Dr Inis Sizer  Neurology to sign off, follow up with Dr. Erlinda Hong in one month.   SIGNED Delbert Phenix, MSN, ANP-C, CNRN, MSCS [facility] Stroke Team [phone]  01/16/2014, 9:08 AM   I, the attending vascular neurologist, have personally obtained a history, examined the patient, evaluated laboratory data and imaging studies, and formulated the assessment and plan of care.  I have made any additions or clarifications directly to the above note and agree with the findings and plan as currently documented. Appreciate speech evaluation.   Antony Contras, MD   To contact Stroke Continuity provider, please refer to [email or web address] After hours, contact General  Neurology

## 2014-01-16 NOTE — Progress Notes (Signed)
Physical Therapy Treatment Patient Details Name: [name] [name] MRN: [id number] DOB: 1951-02-13 Today's Date: 01/16/2014    History of Present Illness [name] [name] is a 63 y.o. male who was in his normal state of health up until 2 days ago at which point he developed right-sided weakness. He was lacking better so he sought medical care today.It is a static deficit since onset. He had an MRI done which shows a deep white matter infarct.    PT Comments    Pt making very good progress with overall mobility.  Continues to demonstrate high level balance deficits, however discussed many exercises that he could do at home in addition to attending OP therapy.  Also discussed importance of calling 911 if having symptoms of another stroke, managing BP/cholesterol, and taking all prescribed medication.  Pt/family verbalized understanding.    Follow Up Recommendations  Outpatient PT;Supervision - Intermittent     Equipment Recommendations  None recommended by PT    Recommendations for Other Services       Precautions / Restrictions Precautions Precautions: Fall Restrictions Weight Bearing Restrictions: No    Mobility  Bed Mobility               General bed mobility comments: Pt on EOB when PT arrived.   Transfers Overall transfer level: Modified independent Equipment used: None Transfers: Sit to/from Stand Sit to Stand: Modified independent (Device/Increase time)         General transfer comment: Pt safe with sit<>stand with safe use of hands on bed prior to sitting/standing.   Ambulation/Gait Ambulation/Gait assistance: Supervision Ambulation Distance (Feet): 600 Feet Assistive device: None Gait Pattern/deviations: Decreased stride length;Step-through pattern;Shuffle;Staggering left     General Gait Details: Pt continues to demonstrate L foot eversion/supination during swing phase of gait with decreased knee flex, however does well in controlled environment.  Continues to  have high level balance deficits.    Stairs            Wheelchair Mobility    Modified Rankin (Stroke Patients Only)       Balance Overall balance assessment: Needs assistance   Sitting balance-Leahy Scale: Normal     Standing balance support: During functional activity Standing balance-Leahy Scale: Good               High level balance activites: Braiding;Backward walking;Other (comment) High Level Balance Comments: Attempted tandem ambulation again today.  Again, pt with increased difficulty and unable to complete without overt LOB.  Performed backwards gait and braiding at min/guard assist level with only single LOB with braiding, but able to self correct.  Also worked on toe taps to styrofoam cups x 20 reps then knocking cups over and placing upwards again with LEs.  Requires min A to prevent overt LOB.     Cognition Arousal/Alertness: Awake/alert Behavior During Therapy: WFL for tasks assessed/performed Overall Cognitive Status: Within Functional Limits for tasks assessed                      Exercises      General Comments General comments (skin integrity, edema, etc.): Continue to provide education on importance of calling 911 if having symptoms of stroke.  Also discussed balance activities that he could perform at home in [place] while standing on sofa cushion with chair in front of him.  Pt and family verbalized understanding.       Pertinent Vitals/Pain No pain    Home Living  Prior Function            PT Goals (current goals can now be found in the care plan section) Acute Rehab PT Goals Patient Stated Goal: hopeful for home today PT Goal Formulation: With patient Time For Goal Achievement: 01/21/14 Potential to Achieve Goals: Good Progress towards PT goals: Progressing toward goals    Frequency  Min 3X/week    PT Plan Current plan remains appropriate;Frequency needs to be updated    Co-evaluation              End of Session   Activity Tolerance: Patient tolerated treatment well Patient left: Other (comment);with call bell/phone within reach;with family/visitor present (on EOB)     Time: 1430-1501 PT Time Calculation (min): 31 min  Charges:  $Neuromuscular Re-education: 23-37 mins                    G Codes:      Denice Bors 01/16/2014, 3:10 PM

## 2014-01-16 NOTE — Progress Notes (Signed)
Discharge instructions given. Pt verbalized understanding and all questions were answered.  

## 2014-01-16 NOTE — Discharge Summary (Signed)
Physician Discharge Summary  [name] [name] XTG:[id number] DOB: [birth year] DOA: 01/13/2014  PCP: No primary provider on file.  Admit date: 01/13/2014 Discharge date: 01/16/2014  Time spent: >30 minutes  Recommendations for Outpatient Follow-up:  Follow-up Information   Follow up with Xu,Jindong, MD In 1 month. (Stroke follow up)    Specialty:  Neurology   Contact information:   [address] [place] [address] [phone]       Follow up with [facility]. (they will call you for a start up date and time for your therapy)    Specialty:  Neuro-Rehabilitation   Contact information:   [place] [id number] [place] [address] [phone]      Please follow up. (PCP in 1-2weeks, call for appt upon discharge)      followup studies -Carotid ultrasound in 3 months per neurologist/Dr. Erlinda Hong   Discharge Diagnoses:  Principal Problem:   Acute ischemic stroke Active Problems:   PVD (peripheral vascular disease)   Other and unspecified hyperlipidemia   GERD (gastroesophageal reflux disease)   Squamous cell carcinoma of soft palate   Carotid stenosis   Discharge Condition: Improved/stable  Diet recommendation: Low sodium heart healthy  Filed Weights   01/13/14 1250  Weight: 73.936 kg (163 lb)    History of present illness:  Patient is a 63 year old With h/o PVD, hyperlipidemia, SSCa soft palate presents to [facility] with slurred speech, right hand and arm numbness and weakness, drooling and unsteady gait. His symptoms started 2 days ago, but he did not seek medical attention, because he "thought it might be allergies". No previous h/o stroke. Denies chest pain or palpitations. He has noted some coughing with liquids. He has continued to eat and take his pills. 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 He is on a statin  and baby aspirin a day. He was admitted for further evaluation and management.   Hospital Course:  Acute ischemic stroke: Not a TPA candidate due to delay in presentation.  -As discussed above upon admission MRI showed Acute deep white matter infarct left centrum semiovale  echocardiogram- LVEF 55-60%, moderate LVH, normal LA size, diastolic dysfunction.  -carotid Dopplers with L. ICA stenosis see below, fasting lipids- Tchol 122, LDL 72, hemoglobin A1c 5.4.  -Neuro followed up in ordered in a CT angiogram of the neck and the plan was that if it showed ICA stenosis of 60% or greater vascular will be consulted>> CTA angio showed no hemodynamically significant cervical left carotid stenosis. High grade atherosclerotic stenosis in the right ICA bulb with radiographic string sign was noted. -Dr. Leonie Man recommended for patient to follow up to have ultrasound in 3 months -Patient had been on aspirin which was changed to 325 on admission>> Dr. Leonie Man followed off and recommended adding Plavix to the aspirin and his to continue both and follow up outpatient. -physical therapy, speech therapy saw patient and recommended- outpt services L. 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  Carotid doppler- pending Findings suggest 1-39% right internal carotid artery stenosis and borderline 40-59% versus 60-79% left internal carotid artery stenosis.  Vertebral arteries are patent with antegrade flow.   Consultations:  Neurology  Discharge Exam: Filed Vitals:   01/16/14 0956  BP: 149/82  Pulse: 69  Temp: 97.8 F (36.6 C)  Resp: 18    Exam:  General: alert & oriented x 3 In NAD, dysarthric  Cardiovascular: RRR, nl S1 s2  Respiratory: CTAB  Abdomen: soft +BS NT/ND, no masses palpable  Extremities: No cyanosis and no edema  Neuro: RUE 4/5 strength, rest of extremities 5/5    Discharge Instructions You were cared for by a hospitalist during your hospital stay. If you have any questions about your discharge medications or the care you received while you were in the hospital after you are discharged, you can call the unit and asked to speak with the hospitalist on call if the hospitalist that took care of you is not available. Once you are discharged, your primary care physician will handle any further medical issues. Please note that NO REFILLS for any discharge medications will be authorized once you are discharged, as it is imperative that you return to your primary care physician (or establish a relationship with a primary care physician if you do not have one) for your aftercare needs so that they can reassess your need for medications and monitor your lab values.     Medication List         aspirin 325 MG tablet  Take 1 tablet (325 mg total) by mouth daily.     atorvastatin 20 MG tablet  Commonly known as:  LIPITOR  Take 20 mg by mouth daily.     clopidogrel 75 MG tablet  Commonly known as:  PLAVIX  Take 1 tablet (75 mg total) by mouth daily.     omeprazole 10 MG capsule  Commonly known as:  PRILOSEC  Take 10 mg by mouth daily.       No Known Allergies     Follow-up Information   Follow up with Xu,Jindong, MD In 1 month. (Stroke follow up)    Specialty:  Neurology   Contact information:   [address] [place] [address] [phone]       Follow up with [facility]. (they will call you for a start up date and time for your therapy)    Specialty:  Neuro-Rehabilitation   Contact information:   [place] [id number] [place] [address] [phone]      Please follow up. (PCP in 1-2weeks, call for appt upon discharge)        The results of significant diagnostics from this hospitalization (including imaging, microbiology, ancillary and laboratory) are listed below for reference.    Significant Diagnostic Studies: Dg Chest 2 View  01/13/2014   CLINICAL DATA:  Right facial droop and difficulty speaking. Question aspiration  EXAM: CHEST  2 VIEW  COMPARISON:  CT chest 07/13/2006.  PA and lateral chest 02/20/2005.  FINDINGS: The lungs demonstrates some emphysematous disease but are clear. Heart size is normal. No pneumothorax or pleural effusion. Cholecystectomy clips are noted. No focal bony abnormality.  IMPRESSION: No acute disease.   Electronically Signed   By: Inge Rise M.D.   On: 01/13/2014 15:30   Ct Head Wo Contrast  01/13/2014   CLINICAL DATA:  Sudden  onset slurred speech. History of head neck cancer  EXAM: CT HEAD WITHOUT CONTRAST  TECHNIQUE: Contiguous axial images were obtained from the base of the skull through the vertex without intravenous contrast.  COMPARISON:  Head CT 12/10/2004  FINDINGS: No acute intracranial hemorrhage. No focal mass lesion. No CT evidence of acute infarction. No midline shift or mass effect. No hydrocephalus. Basilar cisterns are patent.  There is extensive white matter hypodensities in the left corona radiata as well as periventricular of white matter. Paranasal sinuses and mastoid air cells are clear.  IMPRESSION: 1. No acute intracranial findings. 2. No intracranial hemorrhage. 3. White matter infarction in the left corona radiata unchanged from prior.   Electronically Signed   By: Suzy Bouchard M.D.   On: 01/13/2014 13:05   Ct Angio Neck W/cm &/or  Wo/cm  01/15/2014   CLINICAL DATA:  63 year old male with left MCA white matter infarct discovered on MRI following abnormal speech and right upper extremity numbness, right facial droop. Left ICA stenosis on carotid ultrasound. Initial encounter. History of right tonsil cancer.  EXAM: CT ANGIOGRAPHY NECK  TECHNIQUE: Multidetector CT imaging of the neck was performed using the standard protocol during bolus administration of intravenous contrast. Multiplanar CT image reconstructions and MIPs were obtained to evaluate the vascular anatomy. Carotid stenosis measurements (when applicable) are obtained utilizing NASCET criteria, using the distal internal carotid diameter as the denominator.  CONTRAST:  53mL OMNIPAQUE IOHEXOL 350 MG/ML SOLN  COMPARISON:  Brain MRI 01/13/2014. Head CT without contrast 01/13/2014. Neck CT 07/13/2006 and earlier.  FINDINGS: Dependent atelectasis in the visualized upper lungs. No superior mediastinal lymphadenopathy. Stable postoperative appearance of the right neck since 2008. Negative thyroid, larynx, pharynx, parapharyngeal spaces, and retropharyngeal space. Surgically absent right submandibular gland as before. Negative left submandibular and parotid glands. Visualized orbit soft tissues are within normal limits. Grossly negative visualized brain parenchyma. Mild paranasal sinus mucosal thickening. No acute osseous abnormality identified. Degenerative changes in the cervical spine.  Interval decreased left level 2 lymph node size. Interval increased level 1A lymph node size (series 401, image 69), with mildly rounded level 1 nodes now measuring up to 7 mm short axis (previously 4 mm). No pathologically enlarged cervical nodes.  VASCULAR FINDINGS:  Bovine arch configuration. Chronic soft and calcified plaque affecting the arch and proximal great vessels.  No right brachiocephalic artery origin or right CCA origin stenosis. Patent right CCA. Densely calcified plaque at the right carotid  bifurcation with superimposed soft plaque combining for radiographic string sign stenosis (series 401, image 76 and sagittal series 404, image 91). The cervical right ICA remains patent, and beyond the bulb is only slightly irregular to the skullbase. Visible right ICA siphon demonstrates moderate calcified plaque without additional hemodynamically significant stenosis identified.  Soft and calcified plaque in the proximal right subclavian artery without hemodynamically significant stenosis. Right vertebral artery origin is patent with mild stenosis related the soft plaque. Negative cervical right vertebral artery. Negative intracranial right vertebral artery with normal right PICA origin and vertebrobasilar junction. Negative visible basilar artery.  Soft and calcified plaque at the left CCA origin without hemodynamically significant stenosis. Patent left CCA. Soft and calcified plaque at the left carotid bifurcation. Plaque continues throughout the left ICA bulb, but less than 50 % stenosis with respect to the distal vessel occurs (see sagittal series 404, image 155). Be on the distal bulb the cervical left ICA is negative to the skullbase. Moderate calcified plaque in the visible left ICA siphon, unclear  to what extent there could be associated cavernous or proximal supra clinoid left ICA stenosis.  Soft and calcified plaque at the left subclavian origin. Difficult to exclude ulcerated plaque (series 401, image 21). No proximal left subclavian artery stenosis. Mild stenosis at the left vertebral artery origin related to soft and calcified plaque. Negative left vertebral artery otherwise. Normal left PICA origin and vertebrobasilar junction.  Review of the MIP images confirms the above findings.  IMPRESSION: 1. High-grade atherosclerotic stenosis right ICA bulb, radiographic string sign. 2. No hemodynamically significant cervical left carotid stenosis. 3. Calcified plaque in both ICA siphons incompletely  visualized. 4. Mild vertebral artery origin stenosis, with otherwise negative vertebral arteries. 5. Chronic postoperative changes to the right neck related to remote tonsillar carcinoma. Increased level 1 lymph nodes since 2008, but not pathologically enlarged. Still, recommend routine followup ENT evaluation of these nodes and/or further CT neck imaging surveillance.   Electronically Signed   By: Lars Pinks M.D.   On: 01/15/2014 17:36   Mr Brain Wo Contrast  01/13/2014   CLINICAL DATA:  Stroke symptoms with speech difficulty beginning 48-72 hr earlier. Right arm numbness. Imbalance. Right facial droop.  EXAM: MRI HEAD WITHOUT CONTRAST  TECHNIQUE: Multiplanar, multiecho pulse sequences of the brain and surrounding structures were obtained without intravenous contrast.  COMPARISON:  CT head 01/13/2014 earlier in the day.  FINDINGS: There is an acute deep white matter infarct in the left centrum semiovale 9 x 13 mm cross-section without significant mass effect or hemorrhage. No other areas of acute infarction are observed. There is no mass lesion or hydrocephalus, and no extra-axial fluid.  Premature for age cerebral and cerebellar atrophy. Moderately extensive T2 and FLAIR hyperintensities throughout the periventricular and subcortical white matter, also affecting the brainstem consistent with chronic microvascular ischemic change. Remote lacunar infarcts are present, most notably in the bilateral basal ganglia and right pons. Flow voids are maintained. Pituitary, pineal, and cerebellar tonsils unremarkable. No upper cervical lesions. Minor chronic sinus disease. Negative orbits and mastoids.  Compared with prior CT, the acute infarct is indistinguishable from areas of chronic ischemia.  IMPRESSION: Acute deep white matter infarct left centrum semiovale. No hemorrhage or mass effect.   Electronically Signed   By: Rolla Flatten M.D.   On: 01/13/2014 16:30    Microbiology: No results found for this or any  previous visit (from the past 240 hour(s)).   Labs: Basic Metabolic Panel:  Recent Labs Lab 01/13/14 1300 01/15/14 0653  NA 137 140  K 3.7 3.9  CL 99 105  CO2 23 24  GLUCOSE 136* 84  BUN 11 11  CREATININE 1.50* 1.40*  CALCIUM 9.8 8.9   Liver Function Tests:  Recent Labs Lab 01/13/14 1300  AST 19  ALT 17  ALKPHOS 108  BILITOT 0.5  PROT 7.3  ALBUMIN 3.9   No results found for this basename: LIPASE, AMYLASE,  in the last 168 hours No results found for this basename: AMMONIA,  in the last 168 hours CBC:  Recent Labs Lab 01/13/14 1300  WBC 9.5  NEUTROABS 6.7  HGB 12.9*  HCT 38.6*  MCV 98.0  PLT 237   Cardiac Enzymes:  Recent Labs Lab 01/13/14 1300  TROPONINI <0.30   BNP: BNP (last 3 results) No results found for this basename: PROBNP,  in the last 8760 hours CBG: No results found for this basename: GLUCAP,  in the last 168 hours     Signed:  Madgeline Rayo C  [facility] 01/16/2014,  1:02 PM

## 2014-01-16 NOTE — Progress Notes (Signed)
Talked to patient about outpatient rehab choices, pt [facility] in [place]; orders and clinical information faxed; IM given to patientAneta Mins [phone]

## 2014-01-19 ENCOUNTER — Telehealth: Payer: Self-pay | Admitting: Internal Medicine

## 2014-01-19 NOTE — Telephone Encounter (Signed)
Patient's wife calling--patient was seen in the hospital with a stroke last week--now patient has trouble getting his breath early in the morning which lasts for 30-45 minutes--is this normal--please call patient.

## 2014-01-22 ENCOUNTER — Ambulatory Visit: Payer: Medicare Other

## 2014-01-22 ENCOUNTER — Ambulatory Visit: Payer: Medicare Other | Attending: Internal Medicine

## 2014-01-22 DIAGNOSIS — Z5189 Encounter for other specified aftercare: Secondary | ICD-10-CM | POA: Insufficient documentation

## 2014-01-22 DIAGNOSIS — Z85819 Personal history of malignant neoplasm of unspecified site of lip, oral cavity, and pharynx: Secondary | ICD-10-CM | POA: Insufficient documentation

## 2014-01-22 DIAGNOSIS — I69991 Dysphagia following unspecified cerebrovascular disease: Secondary | ICD-10-CM | POA: Diagnosis not present

## 2014-01-22 DIAGNOSIS — R1310 Dysphagia, unspecified: Secondary | ICD-10-CM | POA: Diagnosis not present

## 2014-01-22 DIAGNOSIS — Z923 Personal history of irradiation: Secondary | ICD-10-CM | POA: Diagnosis not present

## 2014-01-22 DIAGNOSIS — I69922 Dysarthria following unspecified cerebrovascular disease: Secondary | ICD-10-CM | POA: Insufficient documentation

## 2014-01-22 NOTE — Telephone Encounter (Signed)
Spoke with wife and patient is much better today, believes that it was coming from dust from a new roof being put on house, did share Dr Guadelupe Sabin message and wife said that patient has an appointment 02/05/14. Will discuss then. Patient is scheduled for a hospital f/u with Dr Erlinda Hong 02/16/14.

## 2014-01-22 NOTE — Telephone Encounter (Signed)
Called and left VM message for more information concerning shortness of breath in mornings

## 2014-01-22 NOTE — Telephone Encounter (Signed)
Please check with provider, PCP? Is he a Dr. Leonie Man patient?

## 2014-01-23 ENCOUNTER — Ambulatory Visit: Payer: Medicare Other | Admitting: Speech Pathology

## 2014-01-23 DIAGNOSIS — Z5189 Encounter for other specified aftercare: Secondary | ICD-10-CM | POA: Diagnosis not present

## 2014-02-07 ENCOUNTER — Telehealth: Payer: Self-pay | Admitting: *Deleted

## 2014-02-07 ENCOUNTER — Ambulatory Visit: Payer: Medicare Other | Attending: Internal Medicine

## 2014-02-07 ENCOUNTER — Other Ambulatory Visit: Payer: Self-pay | Admitting: *Deleted

## 2014-02-07 DIAGNOSIS — I69991 Dysphagia following unspecified cerebrovascular disease: Secondary | ICD-10-CM | POA: Diagnosis not present

## 2014-02-07 DIAGNOSIS — R1310 Dysphagia, unspecified: Secondary | ICD-10-CM | POA: Insufficient documentation

## 2014-02-07 DIAGNOSIS — I69922 Dysarthria following unspecified cerebrovascular disease: Secondary | ICD-10-CM | POA: Insufficient documentation

## 2014-02-07 DIAGNOSIS — Z85819 Personal history of malignant neoplasm of unspecified site of lip, oral cavity, and pharynx: Secondary | ICD-10-CM | POA: Insufficient documentation

## 2014-02-07 DIAGNOSIS — Z5189 Encounter for other specified aftercare: Secondary | ICD-10-CM | POA: Insufficient documentation

## 2014-02-07 DIAGNOSIS — Z923 Personal history of irradiation: Secondary | ICD-10-CM | POA: Diagnosis not present

## 2014-02-07 NOTE — Telephone Encounter (Signed)
Duplicate note

## 2014-02-07 NOTE — Telephone Encounter (Signed)
Appointment rescheduled.

## 2014-02-09 ENCOUNTER — Ambulatory Visit: Payer: Medicare Other

## 2014-02-13 ENCOUNTER — Ambulatory Visit: Payer: Medicare Other

## 2014-02-13 DIAGNOSIS — I69922 Dysarthria following unspecified cerebrovascular disease: Secondary | ICD-10-CM | POA: Diagnosis not present

## 2014-02-13 DIAGNOSIS — R1310 Dysphagia, unspecified: Secondary | ICD-10-CM | POA: Diagnosis not present

## 2014-02-13 DIAGNOSIS — Z5189 Encounter for other specified aftercare: Secondary | ICD-10-CM | POA: Diagnosis not present

## 2014-02-13 DIAGNOSIS — Z923 Personal history of irradiation: Secondary | ICD-10-CM | POA: Diagnosis not present

## 2014-02-13 DIAGNOSIS — I69991 Dysphagia following unspecified cerebrovascular disease: Secondary | ICD-10-CM | POA: Diagnosis not present

## 2014-02-13 DIAGNOSIS — Z85819 Personal history of malignant neoplasm of unspecified site of lip, oral cavity, and pharynx: Secondary | ICD-10-CM | POA: Diagnosis not present

## 2014-02-14 ENCOUNTER — Encounter: Payer: Self-pay | Admitting: Neurology

## 2014-02-14 ENCOUNTER — Ambulatory Visit (INDEPENDENT_AMBULATORY_CARE_PROVIDER_SITE_OTHER): Payer: Medicare Other | Admitting: Neurology

## 2014-02-14 VITALS — BP 136/69 | HR 67 | Ht 64.0 in | Wt 163.4 lb

## 2014-02-14 DIAGNOSIS — I739 Peripheral vascular disease, unspecified: Secondary | ICD-10-CM | POA: Diagnosis not present

## 2014-02-14 DIAGNOSIS — I6350 Cerebral infarction due to unspecified occlusion or stenosis of unspecified cerebral artery: Secondary | ICD-10-CM

## 2014-02-14 DIAGNOSIS — I6521 Occlusion and stenosis of right carotid artery: Secondary | ICD-10-CM

## 2014-02-14 DIAGNOSIS — I6529 Occlusion and stenosis of unspecified carotid artery: Secondary | ICD-10-CM | POA: Diagnosis not present

## 2014-02-14 DIAGNOSIS — I639 Cerebral infarction, unspecified: Secondary | ICD-10-CM

## 2014-02-14 MED ORDER — ASPIRIN 81 MG PO TABS
81.0000 mg | ORAL_TABLET | Freq: Every day | ORAL | Status: DC
Start: 2014-02-14 — End: 2014-04-25

## 2014-02-14 MED ORDER — CLOPIDOGREL BISULFATE 75 MG PO TABS: 75.0000 mg | ORAL_TABLET | Freq: Every day | ORAL | Status: AC

## 2014-02-14 NOTE — Progress Notes (Signed)
STROKE NEUROLOGY FOLLOW UP NOTE  NAME: [name] [name] DOB: 07-01-1951  REASON FOR VISIT: stroke follow up HISTORY FROM: pt and chart  Today we had the pleasure of seeing [name] [name] in follow-up at our Neurology Clinic. Pt was accompanied by no one.   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 MRI showed acute deep white matter infarct at the left centrum semiovale. 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 His right-sided weakness improved during hospitalization and he was discharged with aspirin and Plavix as well as Lipitor for stroke prevention. He came in today for stroke follow up.  He was following with Dr. Scot Dock for the right lower extremity peripheral vascular disease until June 2011, but he had not been seen recently. He was also following with Dr. 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  Interval History During the interval time, the patient has been doing  stable.  He stated that his right-sided weakness largely resolved. However, he still has difficulty with drinking and eating most of the time because of choking. He falls with speech pathologist as outpatient for speech therapy but he didn't mention choking during the session. About 3 weeks ago, his wife called for his SOB  On awakening in the morning, but later on it that was attributed to a new roofing material. This was improved since then. However, he still concerning whether that episode is because of aspiration while with lying position.  He had a primary  care doctor in [place], which he visited up to the hospitalization and was refilled with aspirin and Plavix. The next visit will be in February 2016.  He quit smoking in 2002 before that he had a smoked for 40 years with 1-1.5 pack per day. He also quit cup for drinking in 2002.  REVIEW OF SYSTEMS: Full 14 system review of systems performed and notable only for those listed below and in HPI above, all others are negative:  Constitutional: N/A  Cardiovascular: N/A  Ear/Nose/Throat: trouble swallowing, drooling  Skin: N/A  Eyes: N/A  Respiratory:  cough, shortness of breath, choking Gastroitestinal: N/A  Genitourinary: N/A Hematology/Lymphatic: N/A  Endocrine: N/A  Musculoskeletal: N/A  Allergy/Immunology: N/A  Neurological: N/A  Psychiatric: restless leg   The following represents the patient's updated allergies and side effects list: No Known Allergies  Labs since last visit of relevance include the following: Results for orders placed during the hospital encounter of 01/13/14  PROTIME-INR      Result Value Ref Range   Prothrombin Time 13.2  11.6 - 15.2 seconds   INR 1.00  0.00 - 1.49  APTT      Result Value Ref Range   aPTT 34  24 - 37 seconds  CBC      Result Value Ref Range   WBC 9.5  4.0 - 10.5 K/uL   RBC 3.94 (*) 4.22 - 5.81 MIL/uL   Hemoglobin 12.9 (*) 13.0 - 17.0 g/dL   HCT 38.6 (*) 39.0 - 52.0 %   MCV 98.0  78.0 -  100.0 fL   MCH 32.7  26.0 - 34.0 pg   MCHC 33.4  30.0 - 36.0 g/dL   RDW 13.4  11.5 - 15.5 %   Platelets 237  150 - 400 K/uL  DIFFERENTIAL      Result Value Ref Range   Neutrophils Relative % 71  43 - 77 %   Neutro Abs 6.7  1.7 - 7.7 K/uL   Lymphocytes Relative 17  12 - 46 %   Lymphs Abs 1.6  0.7 - 4.0 K/uL   Monocytes Relative 8  3 - 12 %   Monocytes Absolute 0.8  0.1 - 1.0 K/uL   Eosinophils Relative 3  0 - 5 %   Eosinophils Absolute 0.3  0.0 - 0.7 K/uL   Basophils Relative 1  0 - 1 %   Basophils Absolute 0.1  0.0 - 0.1 K/uL  COMPREHENSIVE  METABOLIC PANEL      Result Value Ref Range   Sodium 137  137 - 147 mEq/L   Potassium 3.7  3.7 - 5.3 mEq/L   Chloride 99  96 - 112 mEq/L   CO2 23  19 - 32 mEq/L   Glucose, Bld 136 (*) 70 - 99 mg/dL   BUN 11  6 - 23 mg/dL   Creatinine, Ser 1.50 (*) 0.50 - 1.35 mg/dL   Calcium 9.8  8.4 - 10.5 mg/dL   Total Protein 7.3  6.0 - 8.3 g/dL   Albumin 3.9  3.5 - 5.2 g/dL   AST 19  0 - 37 U/L   ALT 17  0 - 53 U/L   Alkaline Phosphatase 108  39 - 117 U/L   Total Bilirubin 0.5  0.3 - 1.2 mg/dL   GFR calc non Af Amer 47 (*) >90 mL/min   GFR calc Af Amer 55 (*) >90 mL/min   Anion gap 15  5 - 15  TROPONIN I      Result Value Ref Range   Troponin I <0.30  <0.30 ng/mL  HEMOGLOBIN A1C      Result Value Ref Range   Hemoglobin A1C 5.4  <5.7 %   Mean Plasma Glucose 108  <117 mg/dL  LIPID PANEL      Result Value Ref Range   Cholesterol 122  0 - 200 mg/dL   Triglycerides 90  <150 mg/dL   HDL 32 (*) >39 mg/dL   Total CHOL/HDL Ratio 3.8     VLDL 18  0 - 40 mg/dL   LDL Cholesterol 72  0 - 99 mg/dL  BASIC METABOLIC PANEL      Result Value Ref Range   Sodium 140  137 - 147 mEq/L   Potassium 3.9  3.7 - 5.3 mEq/L   Chloride 105  96 - 112 mEq/L   CO2 24  19 - 32 mEq/L   Glucose, Bld 84  70 - 99 mg/dL   BUN 11  6 - 23 mg/dL   Creatinine, Ser 1.40 (*) 0.50 - 1.35 mg/dL   Calcium 8.9  8.4 - 10.5 mg/dL   GFR calc non Af Amer 51 (*) >90 mL/min   GFR calc Af Amer 59 (*) >90 mL/min   Anion gap 11  5 - 15    The neurologically relevant items on the patient's problem list were reviewed on today's visit.  Neurologic Examination  A problem focused neurological exam (12 or more points of the single system neurologic examination, vital signs counts as 1 point, cranial nerves count for 8  points) was performed.  Blood pressure 136/69, pulse 67, height 5\' 4"  (1.626 m), weight 163 lb 6.4 oz (74.118 kg).  General - Well nourished, well developed, in no apparent distress.  Ophthalmologic - Sharp disc margins  OU.  Cardiovascular - Regular rate and rhythm with no murmur.  Mental Status -  Level of arousal and orientation to time, place, and person were intact. Language including expression, naming, repetition, comprehension was assessed and found intact. Attention span and concentration were normal. Recent and remote memory were 3/3 registration and 2/3 delayed recall. Fund of Knowledge was assessed and was intact.  Cranial Nerves II - XII - II - Visual field intact OU. III, IV, VI - Extraocular movements intact. V - Facial sensation intact bilaterally. VII - Facial movement intact bilaterally. VIII - Hearing & vestibular intact bilaterally. X - Palate elevates symmetrically. XI - Chin turning & shoulder shrug intact bilaterally. XII - Tongue protrusion intact.  Motor Strength - The patient's strength was normal in all extremities and pronator drift was absent.  Bulk was normal and fasciculations were absent.   Motor Tone - Muscle tone was assessed at the neck and appendages and was normal.  Reflexes - The patient's reflexes were normal in all extremities and he had no pathological reflexes.  Sensory - Light touch, temperature/pinprick and Romberg testing were assessed and were normal.    Coordination - The patient had normal movements in the hands and feet with no ataxia or dysmetria.  Tremor was absent.  Gait and Station - antalgic gait at right due to right hip pain .  Data reviewed: I personally reviewed the images and agree with the radiology interpretations.  CT Head Wo Contrast 01/13/2014  1. No acute intracranial findings.  2. No intracranial hemorrhage.  3. White matter infarction in the left corona radiata unchanged from prior.  Mr Brain Wo Contrast  01/13/2014 Acute deep white matter infarct left centrum semiovale. No hemorrhage or mass effect.  CTA of the neck 01/15/14 1. High-grade atherosclerotic stenosis right ICA bulb, radiographic  string sign.  2. No  hemodynamically significant cervical left carotid stenosis.  3. Calcified plaque in both ICA siphons incompletely visualized.  4. Mild vertebral artery origin stenosis, with otherwise negative  vertebral arteries.  5. Chronic postoperative changes to the right neck related to remote  tonsillar carcinoma. Increased level 1 lymph nodes since 2008, but  not pathologically enlarged. Still, recommend routine followup ENT  evaluation of these nodes and/or further CT neck imaging  surveillance Carotid Doppler 1-39% right internal carotid artery stenosis, elevated velocities suggest borderline 60-79% left internal stenosis  2D Echocardiogram - ejection fraction 55-60%. No cardiac source of embolism identified.  EKG normal sinus rhythm rate 69 beats per minute.   Component     Latest Ref Rng 01/14/2014  Cholesterol     0 - 200 mg/dL 122  Triglycerides     <150 mg/dL 90  HDL     >39 mg/dL 32 (L)  Total CHOL/HDL Ratio      3.8  VLDL     0 - 40 mg/dL 18  LDL (calc)     0 - 99 mg/dL 72  Hemoglobin A1C     <5.7 % 5.4  Mean Plasma Glucose     <117 mg/dL 108    Assessment: As you may recall, he is a 63 y.o. Caucasian male with a diagnosis of  left subcortical stroke. His right-sided weakness almost resolved. He had a CT angiogram of the  neck showing left ICA 50% stenosis and the right ICA high grade stenosis. He is on aspirin Plavix and Lipitor now for stroke prevention. Will keep him on dural antiplatelet for about 3 months total and then monotherapy. He is right ICA high-grade stenosis is asymmetric at this moment, most likely related to atherosclerotic changes and also related to his history of radiation at right-sided neck. Although it is asymmetric at this moment, do to his left ICA also stenosis at 50%, I would like him to followup with his vascular surgeon Dr. Scot Dock to consider right ICA stent in the setting of CREST-II clinical trial. He also needs speech therapy followup regarding swallow  function. We'll do PCP to evaluate intracranial vasculature to complete stroke workup. Will repeat carotid Doppler in 2 months.  Plan:  - Continue aspirin and Plavix as well as Lipitor for stroke prevention - Encouraged him to talk to his speech pathologist in the morning regarding the swallow function evaluation - Followup with Dr. Scot Dock to consider right ICA stent for asymptomatic ICA stenosis s/p radiation in the setting of CREST-II clinical trial - TCD to complete stroke workup - No repeat carotid Doppler in 2 months - RTC in 2 months  Diagnoses from this visit: Acute ischemic stroke - Plan: clopidogrel (PLAVIX) 75 MG tablet, aspirin 81 MG tablet, [place] TCD COMPLETE  Orders Placed This Encounter  Procedures  . [place] TCD COMPLETE    Standing Status: Future     Number of Occurrences:      Standing Expiration Date: 04/17/2015    Order Specific Question:  Reason for Exam (SYMPTOM  OR DIAGNOSIS REQUIRED)    Answer:  stroke    Order Specific Question:  Preferred imaging location?    Answer:  Internal   Meds ordered this encounter  Medications  . clopidogrel (PLAVIX) 75 MG tablet    Sig: Take 1 tablet (75 mg total) by mouth daily.    Dispense:  30 tablet    Refill:  1  . aspirin 81 MG tablet    Sig: Take 1 tablet (81 mg total) by mouth daily.    Dispense:  30 tablet    Refill:  1   Patient Instructions  1. Continue ASA 81mg  and plavix for another 2 months, and then plavix alone 2. Continue the lipitor for stroke prevention 3. Will schedule TCD to complete stroke work up 4. Send note to SLP for evaluation of swallowing 5. Recommend to follow up with vascular surgery Dr. Scot Dock for asymptomatic right ICA stenosis. 6. Repeat carotid doppler in 2 months 7. [facility] 8. Follow up in 2 months.    Rosalin Hawking, MD PhD [facility] [address], [place], [place] [address] [phone]

## 2014-02-14 NOTE — Patient Instructions (Signed)
1. Continue ASA 81mg  and plavix for another 2 months, and then plavix alone 2. Continue the lipitor for stroke prevention 3. Will schedule TCD to complete stroke work up 4. Send note to SLP for evaluation of swallowing 5. Recommend to follow up with vascular surgery Dr. Scot Dock for asymptomatic right ICA stenosis. 6. Repeat carotid doppler in 2 months 7. [facility] 8. Follow up in 2 months.

## 2014-02-15 ENCOUNTER — Ambulatory Visit: Payer: Medicare Other | Admitting: Speech Pathology

## 2014-02-15 DIAGNOSIS — I69922 Dysarthria following unspecified cerebrovascular disease: Secondary | ICD-10-CM | POA: Diagnosis not present

## 2014-02-15 DIAGNOSIS — I69991 Dysphagia following unspecified cerebrovascular disease: Secondary | ICD-10-CM | POA: Diagnosis not present

## 2014-02-15 DIAGNOSIS — Z5189 Encounter for other specified aftercare: Secondary | ICD-10-CM | POA: Diagnosis not present

## 2014-02-15 DIAGNOSIS — R1310 Dysphagia, unspecified: Secondary | ICD-10-CM | POA: Diagnosis not present

## 2014-02-15 DIAGNOSIS — Z923 Personal history of irradiation: Secondary | ICD-10-CM | POA: Diagnosis not present

## 2014-02-15 DIAGNOSIS — Z85819 Personal history of malignant neoplasm of unspecified site of lip, oral cavity, and pharynx: Secondary | ICD-10-CM | POA: Diagnosis not present

## 2014-02-16 ENCOUNTER — Ambulatory Visit: Payer: Self-pay | Admitting: Neurology

## 2014-02-20 ENCOUNTER — Ambulatory Visit: Payer: Medicare Other | Admitting: Speech Pathology

## 2014-02-20 DIAGNOSIS — I69991 Dysphagia following unspecified cerebrovascular disease: Secondary | ICD-10-CM | POA: Diagnosis not present

## 2014-02-20 DIAGNOSIS — Z5189 Encounter for other specified aftercare: Secondary | ICD-10-CM | POA: Diagnosis not present

## 2014-02-20 DIAGNOSIS — Z85819 Personal history of malignant neoplasm of unspecified site of lip, oral cavity, and pharynx: Secondary | ICD-10-CM | POA: Diagnosis not present

## 2014-02-20 DIAGNOSIS — R1310 Dysphagia, unspecified: Secondary | ICD-10-CM | POA: Diagnosis not present

## 2014-02-20 DIAGNOSIS — Z923 Personal history of irradiation: Secondary | ICD-10-CM | POA: Diagnosis not present

## 2014-02-20 DIAGNOSIS — I69922 Dysarthria following unspecified cerebrovascular disease: Secondary | ICD-10-CM | POA: Diagnosis not present

## 2014-02-22 ENCOUNTER — Ambulatory Visit: Payer: Medicare Other | Admitting: Speech Pathology

## 2014-02-22 DIAGNOSIS — I69922 Dysarthria following unspecified cerebrovascular disease: Secondary | ICD-10-CM | POA: Diagnosis not present

## 2014-02-22 DIAGNOSIS — R1310 Dysphagia, unspecified: Secondary | ICD-10-CM | POA: Diagnosis not present

## 2014-02-22 DIAGNOSIS — Z923 Personal history of irradiation: Secondary | ICD-10-CM | POA: Diagnosis not present

## 2014-02-22 DIAGNOSIS — I69991 Dysphagia following unspecified cerebrovascular disease: Secondary | ICD-10-CM | POA: Diagnosis not present

## 2014-02-22 DIAGNOSIS — Z5189 Encounter for other specified aftercare: Secondary | ICD-10-CM | POA: Diagnosis not present

## 2014-02-22 DIAGNOSIS — Z85819 Personal history of malignant neoplasm of unspecified site of lip, oral cavity, and pharynx: Secondary | ICD-10-CM | POA: Diagnosis not present

## 2014-02-27 ENCOUNTER — Ambulatory Visit (INDEPENDENT_AMBULATORY_CARE_PROVIDER_SITE_OTHER): Payer: Medicare Other

## 2014-02-27 ENCOUNTER — Ambulatory Visit: Payer: Medicare Other | Admitting: Speech Pathology

## 2014-02-27 DIAGNOSIS — R1310 Dysphagia, unspecified: Secondary | ICD-10-CM | POA: Diagnosis not present

## 2014-02-27 DIAGNOSIS — Z85819 Personal history of malignant neoplasm of unspecified site of lip, oral cavity, and pharynx: Secondary | ICD-10-CM | POA: Diagnosis not present

## 2014-02-27 DIAGNOSIS — I69991 Dysphagia following unspecified cerebrovascular disease: Secondary | ICD-10-CM | POA: Diagnosis not present

## 2014-02-27 DIAGNOSIS — I639 Cerebral infarction, unspecified: Secondary | ICD-10-CM

## 2014-02-27 DIAGNOSIS — Z923 Personal history of irradiation: Secondary | ICD-10-CM | POA: Diagnosis not present

## 2014-02-27 DIAGNOSIS — I69922 Dysarthria following unspecified cerebrovascular disease: Secondary | ICD-10-CM | POA: Diagnosis not present

## 2014-02-27 DIAGNOSIS — Z5189 Encounter for other specified aftercare: Secondary | ICD-10-CM | POA: Diagnosis not present

## 2014-02-27 DIAGNOSIS — I6350 Cerebral infarction due to unspecified occlusion or stenosis of unspecified cerebral artery: Secondary | ICD-10-CM | POA: Diagnosis not present

## 2014-03-13 ENCOUNTER — Encounter: Payer: Self-pay | Admitting: Vascular Surgery

## 2014-03-14 ENCOUNTER — Encounter: Payer: Self-pay | Admitting: Vascular Surgery

## 2014-03-14 ENCOUNTER — Ambulatory Visit (INDEPENDENT_AMBULATORY_CARE_PROVIDER_SITE_OTHER): Payer: Medicare Other | Admitting: Vascular Surgery

## 2014-03-14 ENCOUNTER — Ambulatory Visit (HOSPITAL_COMMUNITY)
Admission: RE | Admit: 2014-03-14 | Discharge: 2014-03-14 | Disposition: A | Discharge status: Home / Self Care | Payer: Medicare Other | Source: Ambulatory Visit | Attending: Vascular Surgery | Admitting: Vascular Surgery

## 2014-03-14 ENCOUNTER — Other Ambulatory Visit: Payer: Self-pay

## 2014-03-14 ENCOUNTER — Encounter (HOSPITAL_COMMUNITY): Payer: Self-pay | Admitting: Pharmacy Technician

## 2014-03-14 VITALS — BP 146/92 | HR 73 | Resp 18 | Ht 64.0 in | Wt 161.0 lb

## 2014-03-14 DIAGNOSIS — Z87891 Personal history of nicotine dependence: Secondary | ICD-10-CM | POA: Diagnosis not present

## 2014-03-14 DIAGNOSIS — K219 Gastro-esophageal reflux disease without esophagitis: Secondary | ICD-10-CM | POA: Insufficient documentation

## 2014-03-14 DIAGNOSIS — Z7982 Long term (current) use of aspirin: Secondary | ICD-10-CM | POA: Diagnosis not present

## 2014-03-14 DIAGNOSIS — E785 Hyperlipidemia, unspecified: Secondary | ICD-10-CM | POA: Diagnosis not present

## 2014-03-14 DIAGNOSIS — I6529 Occlusion and stenosis of unspecified carotid artery: Secondary | ICD-10-CM | POA: Insufficient documentation

## 2014-03-14 DIAGNOSIS — Z8673 Personal history of transient ischemic attack (TIA), and cerebral infarction without residual deficits: Secondary | ICD-10-CM | POA: Insufficient documentation

## 2014-03-14 DIAGNOSIS — Z7901 Long term (current) use of anticoagulants: Secondary | ICD-10-CM | POA: Insufficient documentation

## 2014-03-14 DIAGNOSIS — I739 Peripheral vascular disease, unspecified: Secondary | ICD-10-CM

## 2014-03-14 NOTE — Assessment & Plan Note (Signed)
This patient had a left brain stroke. Carotid duplex in the hospital suggested a 60-79% left carotid stenosis. However CT angiogram did not show a significant stenosis here although there was calcific plaque present at the bifurcation. Carotid duplex scan here showed a less than 40% stenosis. This reason I recommended we proceed with a cerebral arteriogram to determine if the stenosis on the left could potentially have caused his stroke. If the left carotid stenosis appears to be significant he could be considered for a left carotid endarterectomy.  In addition, carotid duplex scan in the hospital suggested no significant carotid stenosis on the right. However, CT angiogram showed a "string sign" on the right. Duplex scan in our office today shows a 60-79% carotid stenosis on the right it appears to be less than 80%. The right side is asymptomatic. If the stenosis were greater than 80% on the right he could potentially be considered for a carotid stent given that he said previous surgery and radiation. He on the right side of his neck. This reason I have recommended that we proceed with cerebral arteriography.  I have discussed the indications for cerebral arteriography. I've also discussed the potential complications of the procedure, including but not limited to: Bleeding, arterial injury, stroke (1%. Procedural risk), renal insufficiency, or other unpredictable medical problems. All the patient's questions were answered and they are agreeable to proceed. The procedure has been scheduled for 03/19/2014. I will make further recommendations pending these results.

## 2014-03-14 NOTE — Progress Notes (Signed)
Patient ID: [name] [name], male   DOB: 1950-10-19, 63 y.o.   MRN: [id number]  Reason for Consult: Carotid evaluation.   Referred by Rosalin Hawking, MD  Subjective:     HPI:  [name] [name] is a 63 y.o. male Who was admitted in July with a left brain stroke. He had the sudden onset of right upper extremity weakness. During that admission an MRI showed an acute deep white matter infarct at the left Centrum Silver male bowel a. Carotid duplex scan that admission showed a 60-79% left carotid stenosis with no significant stenosis on the right. However, subsequent CT angiogram of the neck showed a string sign on the right with a critical stenosis and no significant stenosis on the left. However by my review of the CT scan there is certainly calcific plaque at the left carotid bifurcation. Therefore there is a discrepancy between the carotid duplex scan and a CT angiogram of the neck.  He states that his right arm weakness which was also associated with some expressive aphasia which has not completely resolved. He states that the right arm weakness has resolved.  His past medical history is significant for resection of a right tonsillar cancer and he also went radiation therapy to his right neck. This was in 2006.  I have seen him in the past with peripheral vascular disease. Dr. Allean Found at performed a prosthetic PTFE bypass on the right leg and a subsequent need a redo bypass with vein and tibial thrombectomy for an ischemic right lower extremity in 2004. He was last seen in our office in 2006.  He is currently on aspirin, Plavix, and a statin. He quit tobacco over 10 years ago.  Past Medical History  Diagnosis Date  . Hyperlipemia   . Cancer     throat, lymph node  . GERD (gastroesophageal reflux disease)   . PVD (peripheral vascular disease)   . Carotid artery occlusion    Family History  Problem Relation Age of Onset  . Lung cancer Mother   . Cancer Mother   . CAD Father   . Heart disease  Father    Past Surgical History  Procedure Laterality Date  . Cholecystectomy    . Lymphadenectomy Right 2009    neck  . Pr vein bypass graft,[id number]-pop      Short Social History:  History  Substance Use Topics  . Smoking status: Former Smoker    Quit date: 03/14/2001  . Smokeless tobacco: Never Used  . Alcohol Use: No    No Known Allergies  Current Outpatient Prescriptions  Medication Sig Dispense Refill  . aspirin 81 MG tablet Take 1 tablet (81 mg total) by mouth daily.  30 tablet  1  . atorvastatin (LIPITOR) 20 MG tablet Take 20 mg by mouth daily.      . clopidogrel (PLAVIX) 75 MG tablet Take 1 tablet (75 mg total) by mouth daily.  30 tablet  1  . omeprazole (PRILOSEC) 10 MG capsule Take 10 mg by mouth daily.       No current facility-administered medications for this visit.    Review of Systems  Constitutional: Negative for chills and fever.  Eyes: Negative for loss of vision.  Respiratory: Negative for cough and wheezing.  Cardiovascular: Negative for chest pain, chest tightness, claudication, dyspnea with exertion, orthopnea and palpitations.  GI: Negative for blood in stool and vomiting.  GU: Negative for dysuria and hematuria.  Musculoskeletal: Negative for leg pain, joint pain and myalgias.  Skin: Negative for rash and wound.  Neurological: Negative for dizziness and speech difficulty.  Hematologic: Negative for bruises/bleeds easily. Psychiatric: Negative for depressed mood.        Objective:  Objective  Filed Vitals:   03/14/14 1008 03/14/14 1009  BP: 140/76 146/92  Pulse: 66 73  Resp: 18   Height: 5\' 4"  (1.626 m)   Weight: 161 lb (73.029 kg)    Body mass index is 27.62 kg/(m^2).  Physical Exam  Constitutional: He is oriented to person, place, and time. He appears well-developed and well-nourished.  HENT:  Head: Normocephalic and atraumatic.  Neck: Neck supple. No JVD present. No thyromegaly present.  Cardiovascular: Normal rate, regular  rhythm, normal heart sounds and normal pulses.  Exam reveals no friction rub.   No murmur heard. Pulmonary/Chest: Breath sounds normal. He has no wheezes. He has no rales.  Abdominal: Soft. Bowel sounds are normal. There is no tenderness.  Musculoskeletal: Normal range of motion. He exhibits no edema.  Lymphadenopathy:    He has no cervical adenopathy.  Neurological: He is alert and oriented to person, place, and time. He has normal strength. No sensory deficit.  Skin: No lesion and no rash noted.  Psychiatric: He has a normal mood and affect.   Data: CT angiogram of the neck: This shows a critical right carotid stenosis at the bifurcation with a "string sign." There significant calcific disease on the left it does not appear to show significant narrowing.  Carotid duplex scan also done in July of 2015 at [facility] showed a 60-79% left carotid stenosis by velocity criteria with a less than 39% right carotid stenosis.  Carotid duplex scan today: Given the discrepancy of the 2 studies in July, I repeated his duplex scan today. In addition I wanted to be sure that given that he had a string sign on the right that this artery was still patent. Carotid duplex scan today shows A 60-79% right carotid stenosis in the upper end of that range. However based on end-diastolic velocity of 97 cm/s, this would suggest that the stenosis is less than 80%. This is the asymptomatic side. On the left side there is a less than 40% stenosis.      Assessment/Plan:     Occlusion and stenosis of carotid artery without mention of cerebral infarction This patient had a left brain stroke. Carotid duplex in the hospital suggested a 60-79% left carotid stenosis. However CT angiogram did not show a significant stenosis here although there was calcific plaque present at the bifurcation. Carotid duplex scan here showed a less than 40% stenosis. This reason I recommended we proceed with a cerebral arteriogram to determine  if the stenosis on the left could potentially have caused his stroke. If the left carotid stenosis appears to be significant he could be considered for a left carotid endarterectomy.  In addition, carotid duplex scan in the hospital suggested no significant carotid stenosis on the right. However, CT angiogram showed a "string sign" on the right. Duplex scan in our office today shows a 60-79% carotid stenosis on the right it appears to be less than 80%. The right side is asymptomatic. If the stenosis were greater than 80% on the right he could potentially be considered for a carotid stent given that he said previous surgery and radiation. He on the right side of his neck. This reason I have recommended that we proceed with cerebral arteriography.  I have discussed the indications for cerebral arteriography. I've also discussed  the potential complications of the procedure, including but not limited to: Bleeding, arterial injury, stroke (1%. Procedural risk), renal insufficiency, or other unpredictable medical problems. All the patient's questions were answered and they are agreeable to proceed. The procedure has been scheduled for 03/19/2014. I will make further recommendations pending these results.      Angelia Mould MD [facility]

## 2014-03-18 MED ORDER — SODIUM CHLORIDE 0.9 % IV SOLN: INTRAVENOUS | Status: DC

## 2014-03-19 ENCOUNTER — Telehealth: Payer: Self-pay | Admitting: Vascular Surgery

## 2014-03-19 ENCOUNTER — Encounter (HOSPITAL_COMMUNITY)
Admission: RE | Disposition: A | Discharge status: Home / Self Care | Payer: Self-pay | Source: Ambulatory Visit | Attending: Vascular Surgery

## 2014-03-19 ENCOUNTER — Other Ambulatory Visit: Payer: Self-pay | Admitting: *Deleted

## 2014-03-19 ENCOUNTER — Ambulatory Visit (HOSPITAL_COMMUNITY)
Admission: RE | Admit: 2014-03-19 | Discharge: 2014-03-19 | Disposition: A | Discharge status: Home / Self Care | Payer: Medicare Other | Source: Ambulatory Visit | Attending: Vascular Surgery | Admitting: Vascular Surgery

## 2014-03-19 DIAGNOSIS — Z923 Personal history of irradiation: Secondary | ICD-10-CM | POA: Insufficient documentation

## 2014-03-19 DIAGNOSIS — Z7902 Long term (current) use of antithrombotics/antiplatelets: Secondary | ICD-10-CM | POA: Insufficient documentation

## 2014-03-19 DIAGNOSIS — Z87891 Personal history of nicotine dependence: Secondary | ICD-10-CM | POA: Diagnosis not present

## 2014-03-19 DIAGNOSIS — I739 Peripheral vascular disease, unspecified: Secondary | ICD-10-CM | POA: Insufficient documentation

## 2014-03-19 DIAGNOSIS — I69920 Aphasia following unspecified cerebrovascular disease: Secondary | ICD-10-CM | POA: Diagnosis not present

## 2014-03-19 DIAGNOSIS — Z85819 Personal history of malignant neoplasm of unspecified site of lip, oral cavity, and pharynx: Secondary | ICD-10-CM | POA: Insufficient documentation

## 2014-03-19 DIAGNOSIS — I6529 Occlusion and stenosis of unspecified carotid artery: Secondary | ICD-10-CM | POA: Insufficient documentation

## 2014-03-19 DIAGNOSIS — Z7982 Long term (current) use of aspirin: Secondary | ICD-10-CM | POA: Insufficient documentation

## 2014-03-19 DIAGNOSIS — K219 Gastro-esophageal reflux disease without esophagitis: Secondary | ICD-10-CM | POA: Insufficient documentation

## 2014-03-19 DIAGNOSIS — Z48812 Encounter for surgical aftercare following surgery on the circulatory system: Secondary | ICD-10-CM

## 2014-03-19 HISTORY — PX: ARCH AORTOGRAM: SHX5501

## 2014-03-19 HISTORY — PX: CEREBRAL ANGIOGRAM: SHX5506

## 2014-03-19 LAB — POCT I-STAT, CHEM 8
BUN: 11 mg/dL (ref 6–23)
CREATININE: 1.4 mg/dL — AB (ref 0.50–1.35)
Calcium, Ion: 1.21 mmol/L (ref 1.13–1.30)
Chloride: 103 mEq/L (ref 96–112)
GLUCOSE: 92 mg/dL (ref 70–99)
HCT: 40 % (ref 39.0–52.0)
HEMOGLOBIN: 13.6 g/dL (ref 13.0–17.0)
Potassium: 3.8 mEq/L (ref 3.7–5.3)
SODIUM: 139 meq/L (ref 137–147)
TCO2: 28 mmol/L (ref 0–100)

## 2014-03-19 SURGERY — CEREBRAL ANGIOGRAM
Anesthesia: LOCAL

## 2014-03-19 MED ORDER — LIDOCAINE HCL (PF) 1 % IJ SOLN
INTRAMUSCULAR | Status: AC
  Filled 2014-03-19: qty 30

## 2014-03-19 MED ORDER — SODIUM CHLORIDE 0.9 % IV SOLN: 1.0000 mL/kg/h | INTRAVENOUS | Status: DC

## 2014-03-19 MED ORDER — HEPARIN (PORCINE) IN NACL 2-0.9 UNIT/ML-% IJ SOLN
INTRAMUSCULAR | Status: AC
  Filled 2014-03-19: qty 1000

## 2014-03-19 NOTE — Op Note (Signed)
   PATIENT: [name] [name]   MRN: [id number] DOB: Mar 09, 1951    DATE OF PROCEDURE: 03/19/2014  INDICATIONS: [name] [name] is a 63 y.o. male who had a left brain stroke. Carotid duplex scan suggested a moderate carotid stenosis on the left but a tight right carotid stenosis. CT angiogram suggested a string sign on the right with no significant stenosis on the left. He is brought in for arteriography because of the discrepancy between the carotid duplex scan and CT angiogram.  PROCEDURE:  1. Ultrasound-guided access to the right common femoral artery 2. Arch aortogram 3. Selective catheterization of the right common carotid artery ( third order catheterization secondary to bovine arch) 4. Selective catheterization of left common carotid artery  SURGEON: Judeth Cornfield. Scot Dock, MD, FACS  ANESTHESIA: local   EBL: minimal  TECHNIQUE: The patient was taken to the peripheral vascular lab. Both groins were prepped and draped in the usual sterile fashion. Under ultrasound guidance, after the skin was anesthetized, the left common femoral artery was cannulated with a micropuncture needle and a micropuncture sheath introduced over wire. The micropuncture sheath was exchanged for a 5 [place] sheath over a [employer] wire. A long pigtail catheter was positioned in the ascending aortic arch and a 40 LAO projection was obtained. This catheter was exchanged for a Berenstein 2 catheter which was used to direct the wire into the right common carotid artery. Selective right common carotid arteriogram was obtained with intracranial and extracranial views. The intracranial views will be interpreted separately by the neuroradiologist. The catheter was then retracted down and used to engage the left common carotid artery which originated off of the innominate artery. The wire was advanced into the common carotid artery the catheter would not advance and therefore the wire was extended and the parents being 2 catheter exchanged for  a long end hole catheter. Selective left common carotid arteriogram was obtained of both intracranial and extra cranial views. The catheter was then removed and the patient transferred to the holding area for removal of the sheath.  FINDINGS:  1. No significant disease of the aortic arch. The patient has a bovine arch. The innominate, right common carotid artery, right subclavian artery, and right vertebral artery are patent. The left common carotid artery, left subclavian artery, and left vertebral artery are patent. 2. On the right side,  which is the asymptomatic side, there is a smooth 70% stenosis. The patient of note has had radiation therapy on this side and surgery also. 3. On the left side, which is the symptomatic side, there is no significant stenosis noted. There is slight irregularity but overall the minimal plaque which is noted does not produce any significant ulceration.  CLINICAL NOTE: The patient be set up for a follow up carotid duplex scan in 6 months. He will continue his aspirin and Plavix and is also on a statin.  Deitra Mayo, MD, FACS [facility]  DATE OF DICTATION:   03/19/2014

## 2014-03-19 NOTE — H&P (View-Only) (Signed)
Patient ID: [name] [name], male   DOB: 1950/08/26, 63 y.o.   MRN: [id number]  Reason for Consult: Carotid evaluation.   Referred by Rosalin Hawking, MD  Subjective:     HPI:  [name] [name] is a 63 y.o. male Who was admitted in July with a left brain stroke. He had the sudden onset of right upper extremity weakness. During that admission an MRI showed an acute deep white matter infarct at the left [place] male bowel a. Carotid duplex scan that admission showed a 60-79% left carotid stenosis with no significant stenosis on the right. However, subsequent CT angiogram of the neck showed a string sign on the right with a critical stenosis and no significant stenosis on the left. However by my review of the CT scan there is certainly calcific plaque at the left carotid bifurcation. Therefore there is a discrepancy between the carotid duplex scan and a CT angiogram of the neck.  He states that his right arm weakness which was also associated with some expressive aphasia which has not completely resolved. He states that the right arm weakness has resolved.  His past medical history is significant for resection of a right tonsillar cancer and he also went radiation therapy to his right neck. This was in 2006.  I have seen him in the past with peripheral vascular disease. Dr. Allean Found at performed a prosthetic PTFE bypass on the right leg and a subsequent need a redo bypass with vein and tibial thrombectomy for an ischemic right lower extremity in 2004. He was last seen in our office in 2006.  He is currently on aspirin, Plavix, and a statin. He quit tobacco over 10 years ago.  Past Medical History  Diagnosis Date  . Hyperlipemia   . Cancer     throat, lymph node  . GERD (gastroesophageal reflux disease)   . PVD (peripheral vascular disease)   . Carotid artery occlusion    Family History  Problem Relation Age of Onset  . Lung cancer Mother   . Cancer Mother   . CAD Father   . Heart disease  Father    Past Surgical History  Procedure Laterality Date  . Cholecystectomy    . Lymphadenectomy Right 2009    neck  . Pr vein bypass graft,aorto-fem-pop      Short Social History:  History  Substance Use Topics  . Smoking status: Former Smoker    Quit date: 03/14/2001  . Smokeless tobacco: Never Used  . Alcohol Use: No    No Known Allergies  Current Outpatient Prescriptions  Medication Sig Dispense Refill  . aspirin 81 MG tablet Take 1 tablet (81 mg total) by mouth daily.  30 tablet  1  . atorvastatin (LIPITOR) 20 MG tablet Take 20 mg by mouth daily.      . clopidogrel (PLAVIX) 75 MG tablet Take 1 tablet (75 mg total) by mouth daily.  30 tablet  1  . omeprazole (PRILOSEC) 10 MG capsule Take 10 mg by mouth daily.       No current facility-administered medications for this visit.    Review of Systems  Constitutional: Negative for chills and fever.  Eyes: Negative for loss of vision.  Respiratory: Negative for cough and wheezing.  Cardiovascular: Negative for chest pain, chest tightness, claudication, dyspnea with exertion, orthopnea and palpitations.  GI: Negative for blood in stool and vomiting.  GU: Negative for dysuria and hematuria.  Musculoskeletal: Negative for leg pain, joint pain and myalgias.  Skin: Negative for rash and wound.  Neurological: Negative for dizziness and speech difficulty.  Hematologic: Negative for bruises/bleeds easily. Psychiatric: Negative for depressed mood.        Objective:  Objective  Filed Vitals:   03/14/14 1008 03/14/14 1009  BP: 140/76 146/92  Pulse: 66 73  Resp: 18   Height: 5\' 4"  (1.626 m)   Weight: 161 lb (73.029 kg)    Body mass index is 27.62 kg/(m^2).  Physical Exam  Constitutional: He is oriented to person, place, and time. He appears well-developed and well-nourished.  HENT:  Head: Normocephalic and atraumatic.  Neck: Neck supple. No JVD present. No thyromegaly present.  Cardiovascular: Normal rate, regular  rhythm, normal heart sounds and normal pulses.  Exam reveals no friction rub.   No murmur heard. Pulmonary/Chest: Breath sounds normal. He has no wheezes. He has no rales.  Abdominal: Soft. Bowel sounds are normal. There is no tenderness.  Musculoskeletal: Normal range of motion. He exhibits no edema.  Lymphadenopathy:    He has no cervical adenopathy.  Neurological: He is alert and oriented to person, place, and time. He has normal strength. No sensory deficit.  Skin: No lesion and no rash noted.  Psychiatric: He has a normal mood and affect.   Data: CT angiogram of the neck: This shows a critical right carotid stenosis at the bifurcation with a "string sign." There significant calcific disease on the left it does not appear to show significant narrowing.  Carotid duplex scan also done in July of 2015 at [facility] hospital showed a 60-79% left carotid stenosis by velocity criteria with a less than 39% right carotid stenosis.  Carotid duplex scan today: Given the discrepancy of the 2 studies in July, I repeated his duplex scan today. In addition I wanted to be sure that given that he had a string sign on the right that this artery was still patent. Carotid duplex scan today shows A 60-79% right carotid stenosis in the upper end of that range. However based on end-diastolic velocity of 97 cm/s, this would suggest that the stenosis is less than 80%. This is the asymptomatic side. On the left side there is a less than 40% stenosis.      Assessment/Plan:     Occlusion and stenosis of carotid artery without mention of cerebral infarction This patient had a left brain stroke. Carotid duplex in the hospital suggested a 60-79% left carotid stenosis. However CT angiogram did not show a significant stenosis here although there was calcific plaque present at the bifurcation. Carotid duplex scan here showed a less than 40% stenosis. This reason I recommended we proceed with a cerebral arteriogram to determine  if the stenosis on the left could potentially have caused his stroke. If the left carotid stenosis appears to be significant he could be considered for a left carotid endarterectomy.  In addition, carotid duplex scan in the hospital suggested no significant carotid stenosis on the right. However, CT angiogram showed a "string sign" on the right. Duplex scan in our office today shows a 60-79% carotid stenosis on the right it appears to be less than 80%. The right side is asymptomatic. If the stenosis were greater than 80% on the right he could potentially be considered for a carotid stent given that he said previous surgery and radiation. He on the right side of his neck. This reason I have recommended that we proceed with cerebral arteriography.  I have discussed the indications for cerebral arteriography. I've also discussed  the potential complications of the procedure, including but not limited to: Bleeding, arterial injury, stroke (1%. Procedural risk), renal insufficiency, or other unpredictable medical problems. All the patient's questions were answered and they are agreeable to proceed. The procedure has been scheduled for 03/19/2014. I will make further recommendations pending these results.      Angelia Mould MD [facility]

## 2014-03-19 NOTE — Telephone Encounter (Addendum)
Message copied by Doristine Section on Mon Mar 19, 2014 12:13 PM ------      Message from: Peter Minium K      Created: Mon Mar 19, 2014 10:33 AM      Regarding: Schedule                   ----- Message -----         From: Angelia Mould, MD         Sent: 03/19/2014   8:27 AM           To: [facility] Charge Pool      Subject: charge                                                   PROCEDURE:       1. Ultrasound-guided access to the right common femoral artery      2. Arch aortogram      3. Selective catheterization of the right common carotid artery ( third order catheterization secondary to bovine arch)      4. Selective catheterization of left common carotid artery            SURGEON: Judeth Cornfield. Scot Dock, MD, FACS            He will need a follow up duplex and office visit in 6 months. Thank you. ------  notified patient of fu appt on 09-19-14 at 12:30, mailed appt. letter

## 2014-03-19 NOTE — Progress Notes (Signed)
Site area: Left groin a 5 fr sheath removed  Site Prior to Removal:  Level 0  Pressure Applied For 20 MINUTES    Minutes Beginning at 0845  Manual:   Yes.    Patient Status During Pull:  stable  Post Pull Groin Site:  Level 0  Post Pull Instructions Given:  Yes.    Post Pull Pulses Present:  Yes.    Dressing Applied:  Yes.    Comments:  VS remain stable thru sheath pull. And Neuro check WNL.  Pt denies any discomfort at this time.

## 2014-03-19 NOTE — Interval H&P Note (Signed)
History and Physical Interval Note:  03/19/2014 7:29 AM  [name] [name]  has presented today for surgery, with the diagnosis of Carotoid stenosis  The various methods of treatment have been discussed with the patient and family. After consideration of risks, benefits and other options for treatment, the patient has consented to  Procedure(s): CEREBRAL ANGIOGRAM (N/A) ARCH AORTOGRAM (N/A) as a surgical intervention .  The patient's history has been reviewed, patient examined, no change in status, stable for surgery.  I have reviewed the patient's chart and labs.  Questions were answered to the patient's satisfaction.     DICKSON,CHRISTOPHER S

## 2014-03-19 NOTE — Discharge Instructions (Signed)

## 2014-03-28 NOTE — Consult Note (Signed)
NAME:  IVERSON, SEES NO.:  [id number]  MEDICAL RECORD NO.:  [id number]  LOCATION:  [facility]                         FACILITY:  [facility]  PHYSICIAN:  Dyneshia Baccam K. Abra Lingenfelter, M.D.DATE OF BIRTH:  1950/07/14  DATE OF CONSULTATION: DATE OF DISCHARGE:  03/19/2014                                CONSULTATION   CLINICAL HISTORY:  Right carotid stenosis.  EXAMINATION:  Intracranial interpretation of bilateral common carotid arteriograms.  FINDINGS:  The right common carotid arteriogram demonstrates a distal cervical ICA to be normal.  The petrous and cavernous and supraclinoid segments are widely patent.  There is a small saccular outpouching projecting inferiorly __________ the petrous cavernous junction.  This is less than 3 mm.  The right middle and the right anterior cerebral artery opacifies into the capillary and venous phases.  Transient opacification via the posterior communicating artery of the right posterior cerebral artery seen.  The left common carotid arteriogram demonstrates the cervical petrous junction to be normal.  The petrous cavernous and supraclinoid segments otherwise widely patent.  Arising from the proximal cavernous segment of the left internal carotid artery is approximately 4 mm saccular outpouching projecting inferiorly.  The left middle and the left anterior cerebral artery opacifies into the capillary and the venous phases.  Cross opacification via the anterior communicating artery of the right anterior cerebral A2 segment is seen.  IMPRESSION: 1. 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 2. Similar less prominent projection from the right internal carotid     artery at the proximal cavernous segment again suggestive of a     small aneurysm.  The remainder of examination is  unremarkable.          ______________________________ Fritz Pickerel. Estanislado Pandy, M.D.     SKD/MEDQ  D:  03/27/2014  T:  03/27/2014  Job:  [id number]

## 2014-04-11 NOTE — Consult Note (Signed)
NAME:  [name] [name], [name] [name] NO.:  [id number]  MEDICAL RECORD NO.:  [id number]  LOCATION:  [facility]                         FACILITY:  [facility]  PHYSICIAN:  Zafirah Vanzee K. Jafari Mckillop, M.D.DATE OF BIRTH:  16-Nov-1950  DATE OF CONSULTATION: DATE OF DISCHARGE:  03/19/2014                                CONSULTATION   CLINICAL HISTORY:  Right carotid stenosis.  EXAMINATION:  Intracranial interpretation of bilateral common carotid arteriograms.  FINDINGS:    The right common carotid arteriogram demonstrates  the    distal cervical ICA to be normal.  The petrous and cavernous segments including the supraclinoid segment are widely patent.  There is a saccular outpouching projecting anteriorly at the petrous cavernous junction measuring less than 3 mm.  The right middle and the right anterior cerebral artery opacify normally into the capillary and venous phases.  Transient opacification via the posterior communicating artery of the right posterior cerebral artery is seen.  The left common carotid arteriogram demonstrates the left cervical petrous ICA junction to be normal.  The distal petrous/cavernous and supraclinoid segments otherwise are widely patent.  Arising from the proximal cavernous segment of the left internal carotid artery is  an approximately 4 mm saccular outpouching projecting inferiorly.  The left middle and the left anterior cerebral artery opacify into the capillary and venous phases.  Cross opacification via the anterior communicating artery of the right anterior cerebral artery, A2 segment is seen.  IMPRESSION: 1. 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 2. Smaller less prominent saccular projection from the right internal     carotid artery at the proximal cavernous segment gain suspicious of     a small  aneurysm. 3. The remainder of examination is unremarkable.          ______________________________ Fritz Pickerel. Estanislado Pandy, M.D.     SKD/MEDQ  D:  04/10/2014  T:  04/11/2014  Job:  [id number]

## 2014-04-25 ENCOUNTER — Ambulatory Visit (INDEPENDENT_AMBULATORY_CARE_PROVIDER_SITE_OTHER): Payer: Medicare Other | Admitting: Neurology

## 2014-04-25 ENCOUNTER — Encounter: Payer: Self-pay | Admitting: Neurology

## 2014-04-25 VITALS — BP 162/78 | HR 62 | Ht 64.0 in | Wt 162.0 lb

## 2014-04-25 DIAGNOSIS — I6350 Cerebral infarction due to unspecified occlusion or stenosis of unspecified cerebral artery: Secondary | ICD-10-CM | POA: Diagnosis not present

## 2014-04-25 DIAGNOSIS — I6529 Occlusion and stenosis of unspecified carotid artery: Secondary | ICD-10-CM | POA: Diagnosis not present

## 2014-04-25 DIAGNOSIS — R1310 Dysphagia, unspecified: Secondary | ICD-10-CM | POA: Diagnosis not present

## 2014-04-25 DIAGNOSIS — I6521 Occlusion and stenosis of right carotid artery: Secondary | ICD-10-CM | POA: Diagnosis not present

## 2014-04-25 DIAGNOSIS — I639 Cerebral infarction, unspecified: Secondary | ICD-10-CM

## 2014-04-25 NOTE — Patient Instructions (Signed)
-   stop ASA today and take only plavix from now on. - continue lipitor for stroke prevention - will do TCD emboli detection and vasomotor reactivity to evaluate carotid stenosis - do MBS and refer to swallow specialist. - repeat carotid doppler with Dr. Scot Dock in 6 months (March) - [facility] risk factor [facility] next Feb.  - follow up in 3 months

## 2014-04-25 NOTE — Progress Notes (Signed)
STROKE NEUROLOGY FOLLOW UP NOTE  NAME: [name] [name] DOB: 17-Mar-1951  REASON FOR VISIT: stroke follow up HISTORY FROM: pt and chart  Today we had the pleasure of seeing [name] [name] in follow-up at our Neurology Clinic. Pt was accompanied by no one.   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 MRI showed acute deep white matter infarct at the left centrum semiovale. 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 His right-sided weakness improved during hospitalization and he was discharged with aspirin and Plavix as well as Lipitor for stroke prevention. He came in today for stroke follow up. He was following with Dr. Scot Dock for the right lower extremity peripheral vascular disease until June 2011, but he had not been seen recently. He was also following with Dr. 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 He had a primary care doctor in [place], which he visited up to the hospitalization and was refilled with aspirin and Plavix. The next visit will be in February 2016. He quit smoking in 2002 before that he had a smoked for 40 years with 1-1.5 pack per day. He also quit cup for drinking in 2002.  02/14/14 follow up - the patient has been doing  stable.  He stated that his right-sided weakness largely resolved. However, he still has difficulty with drinking and eating most of the time because of choking. He falls with speech pathologist as outpatient for speech therapy but he didn't mention choking during the session.  Interval History During  the interval time, pt was stable. No new neurological symptoms. He still has problem with swallowing and intermittent choking. Has not seen swallow yet. He saw Dr. Scot Dock about carotid stenosis and had cerebral angiogram done showed asymptomatic right 70% stenosis and left no significant stenosis. No intervention needed. He will follow up with Dr. Scot Dock for CUS in 6 months. He has taken dural antiplatelet for 3 months now, will switch to plavix alone.  REVIEW OF SYSTEMS: Full 14 system review of systems performed and notable only for those listed below and in HPI above, all others are negative:  Constitutional: N/A  Cardiovascular: N/A  Ear/Nose/Throat: trouble swallowing, drooling  Skin: N/A  Eyes: N/A  Respiratory:  cough, shortness of breath, choking Gastroitestinal: N/A  Genitourinary: N/A Hematology/Lymphatic: N/A  Endocrine: N/A  Musculoskeletal: N/A  Allergy/Immunology: N/A  Neurological: N/A  Psychiatric: restless leg   The following represents the patient's updated allergies and side effects list: No Known Allergies  Labs since last visit of relevance include the following: Results for orders placed during the hospital encounter of 03/19/14  POCT I-STAT, CHEM 8      Result Value Ref Range   Sodium 139  137 - 147 mEq/L   Potassium 3.8  3.7 - 5.3 mEq/L   Chloride 103  96 - 112 mEq/L   BUN 11  6 - 23 mg/dL   Creatinine, Ser 1.40 (*) 0.50 - 1.35 mg/dL   Glucose, Bld 92  70 - 99 mg/dL   Calcium, Ion 1.21  1.13 - 1.30 mmol/L   TCO2 28  0 - 100 mmol/L   Hemoglobin 13.6  13.0 - 17.0 g/dL   HCT 40.0  39.0 - 52.0 %    The neurologically relevant items on the patient's problem list were reviewed on today's visit.  Neurologic Examination  A problem focused neurological exam (12 or more points of the single system neurologic examination, vital signs counts as 1 point, cranial nerves count for 8 points) was performed.  Blood pressure 162/78, pulse 62, height 5\' 4"  (1.626 m),  weight 162 lb (73.483 kg).  General - Well nourished, well developed, in no apparent distress.  Ophthalmologic - Sharp disc margins OU.  Cardiovascular - Regular rate and rhythm with no murmur.  Mental Status -  Level of arousal and orientation to time, place, and person were intact. Language including expression, naming, repetition, comprehension was assessed and found intact. Attention span and concentration were normal. Recent and remote memory were 3/3 registration and 2/3 delayed recall. Fund of Knowledge was assessed and was intact.  Cranial Nerves II - XII - II - Visual field intact OU. III, IV, VI - Extraocular movements intact. V - Facial sensation intact bilaterally. VII - Facial movement intact bilaterally. VIII - Hearing & vestibular intact bilaterally. X - Palate elevates symmetrically. XI - Chin turning & shoulder shrug intact bilaterally. XII - Tongue protrusion intact.  Motor Strength - The patient's strength was normal in all extremities and pronator drift was absent.  Bulk was normal and fasciculations were absent.   Motor Tone - Muscle tone was assessed at the neck and appendages and was normal.  Reflexes - The patient's reflexes were normal in all extremities and he had no pathological reflexes.  Sensory - Light touch, temperature/pinprick and Romberg testing were assessed and were normal.    Coordination - The patient had normal movements in the hands and feet with no ataxia or dysmetria.  Tremor was absent.  Gait and Station - antalgic gait at right due to right hip pain .  Data reviewed: I personally reviewed the images and agree with the radiology interpretations.  CT Head Wo Contrast 01/13/2014  1. No acute intracranial findings.  2. No intracranial hemorrhage.  3. White matter infarction in the left corona radiata unchanged from prior.  Mr Brain Wo Contrast  01/13/2014 Acute deep white matter infarct left centrum semiovale. No hemorrhage or mass  effect.  CTA of the neck 01/15/14 1. High-grade atherosclerotic stenosis right ICA bulb, radiographic  string sign.  2. No hemodynamically significant cervical left carotid stenosis.  3. Calcified plaque in both ICA siphons incompletely visualized.  4. Mild vertebral artery origin stenosis, with otherwise negative  vertebral arteries.  5. Chronic postoperative changes to the right neck related to remote  tonsillar carcinoma. Increased level 1 lymph nodes since 2008, but  not pathologically enlarged. Still, recommend routine followup ENT  evaluation of these nodes and/or further CT neck imaging  surveillance Carotid Doppler 1-39% right internal carotid artery stenosis, elevated velocities suggest borderline 60-79% left internal stenosis  2D Echocardiogram - ejection fraction 55-60%. No cardiac source of embolism identified.  EKG normal sinus rhythm rate 69 beats per minute. Cerebral angiogram  -  1. No significant disease of the aortic arch. The patient has a bovine arch. The innominate, right common carotid artery, right subclavian artery, and right vertebral artery are patent. The left common carotid artery, left subclavian artery, and left vertebral artery are patent.  2. On the right side,  which is the asymptomatic side, there is a smooth 70% stenosis. The patient of note has had radiation therapy on this side and surgery also.  3. On the left side, which is the symptomatic side, there is no significant stenosis noted. There is slight irregularity but overall the minimal plaque which is noted does not produce any significant ulceration.   Component     Latest Ref Rng 01/14/2014  Cholesterol     0 - 200 mg/dL 122  Triglycerides     <150 mg/dL 90  HDL     >39 mg/dL 32 (L)  Total CHOL/HDL Ratio      3.8  VLDL     0 - 40 mg/dL 18  LDL (calc)     0 - 99 mg/dL 72  Hemoglobin A1C     <5.7 % 5.4  Mean Plasma Glucose     <117 mg/dL 108    Assessment: As you may recall, he is a 63  y.o. Caucasian male with a diagnosis of  left subcortical stroke. His right-sided weakness almost resolved. He had a CT angiogram of the neck showing left ICA 50% stenosis and the right ICA high grade stenosis. He is on aspirin Plavix and Lipitor now for stroke prevention. He has on dural antiplatelet for about 3 months now and will switch to monotherapy. He is right ICA high-grade stenosis is asymmetric at this moment, most likely related to atherosclerotic changes and also related to his history of radiation at right-sided neck. Dr. Scot Dock had angiogram showed right about 70% and left open, so no intervention at this time. Pt will follow up with him for carotid doppler monitoring.   To really assess the need of CEA or stenting for pt with asymptomatic carotid stenosis, we will further test TCD VMR to evaluate right brain reserve, also will do TCD MES to see if clot . Again swallow referral.   Plan:  - switch to monotherapy of plavix for stroke prevention - continue lipitor for stroke prevention - TCD EMS and VMR is a good way to evaluate the asymptomatic carotid stenosis. - again referral to swallow after MBS - follow up with Dr. Scot Dock for monitoring of carotid doppler - [facility] - RTC in 3 months.  Orders Placed This Encounter  Procedures  . [place] TCD [facility]    Standing Status: Future     Number of Occurrences:      Standing Expiration Date: 10/26/2014    Order Specific Question:  Reason for Exam (SYMPTOM  OR DIAGNOSIS REQUIRED)    Answer:  carotid stenosis    Order Specific Question:  Preferred imaging location?    Answer:  Internal  . [place] TCD VMR    Standing Status: Future     Number of Occurrences:      Standing Expiration Date: 10/26/2014    Order Specific Question:  Reason for Exam (SYMPTOM  OR DIAGNOSIS REQUIRED)    Answer:  carotid stenosis    Order Specific Question:  Preferred imaging location?    Answer:  Internal  . SLP eval and treat     MBS and then swallow evaluation.    Standing Status: Future     Number of Occurrences:      Standing Expiration Date: 04/26/2015      Patient Instructions  - stop ASA today and take only plavix from now on. - continue lipitor for stroke prevention - will do TCD emboli detection and vasomotor reactivity to  evaluate carotid stenosis - do MBS and refer to swallow specialist. - repeat carotid doppler with Dr. Scot Dock in 6 months (March) - [facility] factor [facility] next Feb.  - follow up in 3 months    Rosalin Hawking, MD PhD [facility] [address], [place], [place] [address] [phone]

## 2014-05-03 ENCOUNTER — Ambulatory Visit (INDEPENDENT_AMBULATORY_CARE_PROVIDER_SITE_OTHER): Payer: Medicare Other

## 2014-05-03 ENCOUNTER — Telehealth: Payer: Self-pay | Admitting: *Deleted

## 2014-05-03 DIAGNOSIS — I6521 Occlusion and stenosis of right carotid artery: Secondary | ICD-10-CM

## 2014-05-03 DIAGNOSIS — Z0289 Encounter for other administrative examinations: Secondary | ICD-10-CM

## 2014-05-04 ENCOUNTER — Other Ambulatory Visit: Payer: Self-pay | Admitting: Neurology

## 2014-05-04 DIAGNOSIS — R1310 Dysphagia, unspecified: Secondary | ICD-10-CM

## 2014-05-04 NOTE — Telephone Encounter (Signed)
Spoke with patient, he states that during his last visit it was mentioned for him to do a Barium swallowing test, patient states that he has completed all test but have not heard about this one yet. Dr Erlinda Hong do you still want patient to have this test, if so please order test. Thank you.

## 2014-05-04 NOTE — Telephone Encounter (Signed)
I just ordered it. Do not if this is the right one, can you please check for me? Thanks  [name] Hawking, MD PhD Stroke Neurology 05/04/2014 12:48 PM

## 2014-05-09 NOTE — Telephone Encounter (Signed)
Patient calling to state that he has not heard about his "MBS", please return call and advise.

## 2014-05-11 ENCOUNTER — Other Ambulatory Visit (HOSPITAL_COMMUNITY): Payer: Self-pay | Admitting: Neurology

## 2014-05-11 DIAGNOSIS — R1310 Dysphagia, unspecified: Secondary | ICD-10-CM

## 2014-05-11 NOTE — Telephone Encounter (Signed)
Called [facility] radiology and scheduled patient's appointment for MBS on 05/16/14 at 11:30 with arrival time of 11:15, called patient and informed him of appointment time and instructed patient to go to [facility] radiology 1st floor, no restrictions and if he needed to reschedule this appointment he should call [phone], patient verbalized understanding.

## 2014-05-16 ENCOUNTER — Ambulatory Visit (HOSPITAL_COMMUNITY)
Admission: RE | Admit: 2014-05-16 | Discharge: 2014-05-16 | Disposition: A | Discharge status: Home / Self Care | Payer: Medicare Other | Source: Ambulatory Visit | Attending: Neurology | Admitting: Neurology

## 2014-05-16 DIAGNOSIS — R1310 Dysphagia, unspecified: Secondary | ICD-10-CM | POA: Diagnosis not present

## 2014-05-16 DIAGNOSIS — R1313 Dysphagia, pharyngeal phase: Secondary | ICD-10-CM | POA: Insufficient documentation

## 2014-05-16 DIAGNOSIS — D0004 Carcinoma in situ of soft palate: Secondary | ICD-10-CM | POA: Diagnosis not present

## 2014-05-16 DIAGNOSIS — R05 Cough: Secondary | ICD-10-CM | POA: Diagnosis not present

## 2014-05-16 NOTE — Procedures (Signed)
Objective Swallowing Evaluation: Modified Barium Swallowing Study  Patient Details  Name: [name] [name] MRN: [id number] Date of Birth: Nov 04, 1950  Today's Date: 05/16/2014 Time: 1132-1210 SLP Time Calculation (min) (ACUTE ONLY): 38 min  Past Medical History:  Past Medical History  Diagnosis Date  . Hyperlipemia   . Cancer     throat, lymph node  . GERD (gastroesophageal reflux disease)   . PVD (peripheral vascular disease)   . Carotid artery occlusion    Past Surgical History:  Past Surgical History  Procedure Laterality Date  . Cholecystectomy    . Lymphadenectomy Right 2009    neck  . Pr vein bypass graft,[id number]     HPI:  Pt with h/o PVD, hyperlipidemia, SSCa soft palate, acute infarct of deep white matter L centrum semiovale in July 2015. Pt was seen by SLP for bedside swallow eval with diet recommendation of dysphagia 2/ thin liquids. MBS today to objectively evaluate swallow function with pt complaining of coughing/ choking during meals, specifically with cereal.       Assessment / Plan / Recommendation Clinical Impression  Dysphagia Diagnosis: Moderate pharyngeal phase dysphagia Clinical impression: The pt presents with a sensorimotor pharyngeal dysphagia characterized by reduced hyolaryngeal excursion/ pharyngeal constriction, delayed swallow initiation with liquids at times to the level of the pyriform sinuses. This resulted in silent penetration during the swallow to the level of the vocal folds with thin liquids. Throat clear was successful in clearing the penetrated material. Head turn to the right side was successful in eliminating penetration. Given these findings, the pt is at moderate risk of aspiration of thin liquids, with risk decreasing when utilizing strategies. Recommend regular diet with thin liquids and the following strategies: small bites/ sips, avoiding mixed consistencies, head turn to the right when swallowing liquids, and intermittent throat clear.  Educated pt and son on these findings and recommendations.     Treatment Recommendation  No treatment recommended at this time    Diet Recommendation Regular;Thin liquid   Liquid Administration via: Cup;No straw Medication Administration: Whole meds with liquid Supervision: Patient able to self feed Compensations: Slow rate;Small sips/bites;Clear throat intermittently (Head turn to right) Postural Changes and/or Swallow Maneuvers: Seated upright 90 degrees;Head turn right during swallow    Other  Recommendations Oral Care Recommendations: Oral care BID   Follow Up Recommendations  None    Frequency and Duration        Pertinent Vitals/Pain n/a    SLP Swallow Goals     General Date of Onset: 05/16/14 HPI: Pt with h/o PVD, hyperlipidemia, SSCa soft palate, acute infarct of deep white matter L centrum semiovale in July 2015. Pt was seen by SLP for bedside swallow eval with diet recommendation of dysphagia 2/ thin liquids. MBS today to objectively evaluate swallow function with pt complaining of coughing/ choking during meals, specifically with cereal.   Type of Study: Modified Barium Swallowing Study Reason for Referral: Objectively evaluate swallowing function Diet Prior to this Study: Regular;Thin liquids Temperature Spikes Noted: No Respiratory Status: Room air History of Recent Intubation: No Behavior/Cognition: Alert;Cooperative;Pleasant mood Oral Cavity - Dentition: Adequate natural dentition Oral Motor / Sensory Function: Impaired motor (uvula deviating to left/ decreased palatal elevation) Oral impairment: Other (Comment) Self-Feeding Abilities: Able to feed self Patient Positioning: Upright in chair Baseline Vocal Quality: Clear Volitional Cough: Strong Volitional Swallow: Able to elicit Anatomy: Within functional limits Pharyngeal Secretions: Not observed secondary MBS    Reason for Referral Objectively evaluate swallowing function   Oral Phase  Oral  Preparation/Oral Phase Oral Phase: WFL   Pharyngeal Phase Pharyngeal Phase Pharyngeal Phase: Impaired Pharyngeal - Thin Pharyngeal - Thin Cup: Delayed swallow initiation;Premature spillage to valleculae;Premature spillage to pyriform sinuses;Reduced pharyngeal peristalsis;Reduced laryngeal elevation;Penetration/Aspiration during swallow Penetration/Aspiration details (thin cup): Material enters airway, CONTACTS cords and not ejected out Pharyngeal - Solids Pharyngeal - Puree: Within functional limits Pharyngeal - Regular: Within functional limits  Cervical Esophageal Phase    GO    Cervical Esophageal Phase Cervical Esophageal Phase: [facility]    Functional Assessment Tool Used: clinical judgment Functional Limitations: Swallowing Swallow Current Status ([id number]): At least 20 percent but less than 40 percent impaired, limited or restricted Swallow Goal Status [phone]): At least 20 percent but less than 40 percent impaired, limited or restricted Swallow Discharge Status [phone]): At least 20 percent but less than 40 percent impaired, limited or restricted    Kern Reap, [place], CCC-SLP 05/16/2014, 12:57 PM

## 2014-06-05 ENCOUNTER — Encounter: Payer: Self-pay | Admitting: *Deleted

## 2014-06-14 ENCOUNTER — Encounter (HOSPITAL_COMMUNITY): Payer: Self-pay | Admitting: Vascular Surgery

## 2014-07-31 ENCOUNTER — Ambulatory Visit (INDEPENDENT_AMBULATORY_CARE_PROVIDER_SITE_OTHER): Payer: Medicare Other | Admitting: Neurology

## 2014-07-31 ENCOUNTER — Encounter: Payer: Self-pay | Admitting: Neurology

## 2014-07-31 VITALS — BP 138/69 | HR 86 | Ht 64.0 in | Wt 161.2 lb

## 2014-07-31 DIAGNOSIS — I6350 Cerebral infarction due to unspecified occlusion or stenosis of unspecified cerebral artery: Secondary | ICD-10-CM | POA: Diagnosis not present

## 2014-07-31 DIAGNOSIS — I639 Cerebral infarction, unspecified: Secondary | ICD-10-CM

## 2014-07-31 DIAGNOSIS — I6521 Occlusion and stenosis of right carotid artery: Secondary | ICD-10-CM | POA: Diagnosis not present

## 2014-07-31 DIAGNOSIS — R1310 Dysphagia, unspecified: Secondary | ICD-10-CM

## 2014-07-31 NOTE — Patient Instructions (Signed)
-   continue plavix and lipitor for stroke prevention - regular diet, thin liquid - repeat carotid doppler with Dr. Scot Dock in 2 months (March) - Follow up with your primary care physician in [place] for stroke risk factor modification. Recommend maintain blood pressure goal <130/80, diabetes with hemoglobin A1c goal below 6.5% and lipids with LDL cholesterol goal below 70 mg/dL.  - follow up in 6 months

## 2014-07-31 NOTE — Progress Notes (Signed)
STROKE NEUROLOGY FOLLOW UP NOTE  NAME: ISAHIA HOLLERBACH DOB: Feb 12, 1951  REASON FOR VISIT: stroke follow up HISTORY FROM: pt and chart  Today we had the pleasure of seeing AVELINO HERREN in follow-up at our [facility]rology [facility]. Pt was accompanied by no one.   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 MRI showed acute deep white matter infarct at the left centrum semiovale. 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 His right-sided weakness improved during hospitalization and he was discharged with aspirin and Plavix as well as Lipitor for stroke prevention. He came in today for stroke follow up. He was following with Dr. Scot Dock for the right lower extremity peripheral vascular disease until June 2011, but he had not been seen recently. He was also following with Dr. 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 He had a primary care doctor in [place], which he visited up to the hospitalization and was refilled with aspirin and Plavix. The next visit will be in February 2016. He quit smoking in 2002 before that he had a smoked for 40 years with 1-1.5 pack per day. He also quit cup for drinking in 2002.  02/14/14 follow up - the patient has been doing  stable.  He stated that his right-sided weakness largely resolved. However, he still has difficulty with drinking and eating most of the time because of choking. He falls with speech pathologist as outpatient for speech therapy but he didn't mention choking during the session.  04/25/14 follow up - pt  was stable. No new neurological symptoms. He still has problem with swallowing and intermittent choking. Has not seen swallow yet. He saw Dr. Scot Dock about carotid stenosis and had cerebral angiogram done showed asymptomatic right 70% stenosis and left no significant stenosis. No intervention needed. He will follow up with Dr. Scot Dock for CUS in 6 months. He has taken dural antiplatelet for 3 months now, will switch to plavix alone.  Interval History During the interval time, patient was doing well. No recurrent neurological symptoms. Has passed swallow study and on regular diet with thin liquid now. He had TCD emboli detection for 3 minutes which showed no emboli detected. TCD VMR study not conclusive. His blood pressure was 138/69 today.   REVIEW OF SYSTEMS: Full 14 system review of systems performed and notable only for those listed below and in HPI above, all others are negative:  Constitutional: N/A  Cardiovascular: N/A  Ear/Nose/Throat: N/A Skin: N/A  Eyes: N/A  Respiratory:  N/A Gastroitestinal: N/A  Genitourinary: N/A Hematology/Lymphatic: N/A  Endocrine: N/A  Musculoskeletal: N/A  Allergy/Immunology: N/A  Neurological: N/A  Psychiatric: N/A   The following represents the patient's updated allergies and side effects list: No Known Allergies  Labs since last visit of relevance include the following: Results for orders placed or performed during the hospital encounter of 03/19/14  I-STAT, chem 8  Result Value Ref Range   Sodium 139 137 - 147 mEq/L   Potassium 3.8 3.7 - 5.3 mEq/L   Chloride 103 96 - 112  mEq/L   BUN 11 6 - 23 mg/dL   Creatinine, Ser 1.40 (H) 0.50 - 1.35 mg/dL   Glucose, Bld 92 70 - 99 mg/dL   Calcium, Ion 1.21 1.13 - 1.30 mmol/L   TCO2 28 0 - 100 mmol/L   Hemoglobin 13.6 13.0 - 17.0 g/dL   HCT 40.0 39.0 - 52.0 %    The neurologically relevant items on the patient's problem list were reviewed on today's visit.  Neurologic Examination  A problem focused  neurological exam (12 or more points of the single system neurologic examination, vital signs counts as 1 point, cranial nerves count for 8 points) was performed.  Blood pressure 138/69, pulse 86, height 5\' 4"  (1.626 m), weight 161 lb 3.2 oz (73.12 kg).  General - Well nourished, well developed, in no apparent distress.  Ophthalmologic - Sharp disc margins OU.  Cardiovascular - Regular rate and rhythm with no murmur.  Mental Status -  Level of arousal and orientation to time, place, and person were intact. Language including expression, naming, repetition, comprehension was assessed and found intact. Attention span and concentration were normal. Recent and remote memory were 3/3 registration and 2/3 delayed recall. Fund of Knowledge was assessed and was intact.  Cranial Nerves II - XII - II - Visual field intact OU. III, IV, VI - Extraocular movements intact. V - Facial sensation intact bilaterally. VII - Facial movement intact bilaterally. VIII - Hearing & vestibular intact bilaterally. X - Palate elevates symmetrically. XI - Chin turning & shoulder shrug intact bilaterally. XII - Tongue protrusion intact.  Motor Strength - The patient's strength was normal in all extremities and pronator drift was absent.  Bulk was normal and fasciculations were absent.   Motor Tone - Muscle tone was assessed at the neck and appendages and was normal.  Reflexes - The patient's reflexes were normal in all extremities and he had no pathological reflexes.  Sensory - Light touch, temperature/pinprick and Romberg testing were assessed and were normal.    Coordination - The patient had normal movements in the hands and feet with no ataxia or dysmetria.  Tremor was absent.  Gait and Station - small stride and slow gait but steady.  Data reviewed: I personally reviewed the images and agree with the radiology interpretations.  CT Head Wo Contrast 01/13/2014  1. No acute intracranial findings.  2.  No intracranial hemorrhage.  3. White matter infarction in the left corona radiata unchanged from prior.  Mr Brain Wo Contrast  01/13/2014 Acute deep white matter infarct left centrum semiovale. No hemorrhage or mass effect.  CTA of the neck 01/15/14 1. High-grade atherosclerotic stenosis right ICA bulb, radiographic  string sign.  2. No hemodynamically significant cervical left carotid stenosis.  3. Calcified plaque in both ICA siphons incompletely visualized.  4. Mild vertebral artery origin stenosis, with otherwise negative  vertebral arteries.  5. Chronic postoperative changes to the right neck related to remote  tonsillar carcinoma. Increased level 1 lymph nodes since 2008, but  not pathologically enlarged. Still, recommend routine followup ENT  evaluation of these nodes and/or further CT neck imaging  surveillance Carotid Doppler 1-39% right internal carotid artery stenosis, elevated velocities suggest borderline 60-79% left internal stenosis  2D Echocardiogram - ejection fraction 55-60%. No cardiac source of embolism identified.  EKG normal sinus rhythm rate 69 beats per minute. Cerebral angiogram  -  1. No significant disease of the aortic arch. The patient has a bovine arch. The innominate, right common carotid artery,  right subclavian artery, and right vertebral artery are patent. The left common carotid artery, left subclavian artery, and left vertebral artery are patent.  2. On the right side, which is the asymptomatic side, there is a smooth 70% stenosis. The patient of note has had radiation therapy on this side and surgery also.  3. On the left side, which is the symptomatic side, there is no significant stenosis noted. There is slight irregularity but overall the minimal plaque which is noted does not produce any significant ulceration. TCD MES - negative for 52min TCD VMR - no increase of BHI bilaterally.   Component     Latest Ref Rng 01/14/2014  Cholesterol     0 - 200  mg/dL 122  Triglycerides     <150 mg/dL 90  HDL     >39 mg/dL 32 (L)  Total CHOL/HDL Ratio      3.8  VLDL     0 - 40 mg/dL 18  LDL (calc)     0 - 99 mg/dL 72  Hemoglobin A1C     <5.7 % 5.4  Mean Plasma Glucose     <117 mg/dL 108    Assessment: As you may recall, he is a 64 y.o. Caucasian male with a diagnosis of  left subcortical stroke. His right-sided weakness almost resolved. He had a CT angiogram of the neck showing left ICA 50% stenosis and the right ICA high grade stenosis. He is on aspirin Plavix and Lipitor now for stroke prevention. He has on dural antiplatelet for about 3 months now and will switch to monotherapy. He is right ICA high-grade stenosis is asymmetric at this moment, most likely related to atherosclerotic changes and also related to his history of radiation at right-sided neck. Dr. Scot Dock had angiogram showed right about 70% and left open, so no intervention at this time. Pt will follow up with him for carotid doppler monitoring  In March. He passed swallow study.  Plan:  - continue Plavix and lipitor for stroke prevention - follow up with Dr. Scot Dock for monitoring of carotid doppler - Follow up with your primary care physician in [place] for stroke risk factor modification. Recommend maintain blood pressure goal <130/80, diabetes with hemoglobin A1c goal below 6.5% and lipids with LDL cholesterol goal below 70 mg/dL.  - RTC in 6 months.  No orders of the defined types were placed in this encounter.      Patient Instructions  - continue plavix and lipitor for stroke prevention - regular diet, thin liquid - repeat carotid doppler with Dr. Scot Dock in 2 months (March) - Follow up with your primary care physician in [place] for stroke risk factor modification. Recommend maintain blood pressure goal <130/80, diabetes with hemoglobin A1c goal below 6.5% and lipids with LDL cholesterol goal below 70 mg/dL.  - follow up in 6 months    Rosalin Hawking, MD PhD [facility]  [facility] [address], [place], [place] [address] [phone]

## 2014-09-05 ENCOUNTER — Other Ambulatory Visit: Payer: Self-pay | Admitting: *Deleted

## 2014-09-05 DIAGNOSIS — I6523 Occlusion and stenosis of bilateral carotid arteries: Secondary | ICD-10-CM

## 2014-09-18 ENCOUNTER — Encounter: Payer: Self-pay | Admitting: Vascular Surgery

## 2014-09-19 ENCOUNTER — Ambulatory Visit (INDEPENDENT_AMBULATORY_CARE_PROVIDER_SITE_OTHER): Payer: Medicare Other | Admitting: Vascular Surgery

## 2014-09-19 ENCOUNTER — Encounter: Payer: Self-pay | Admitting: Vascular Surgery

## 2014-09-19 ENCOUNTER — Ambulatory Visit (HOSPITAL_COMMUNITY)
Admission: RE | Admit: 2014-09-19 | Discharge: 2014-09-19 | Disposition: A | Discharge status: Home / Self Care | Payer: Medicare Other | Source: Ambulatory Visit | Attending: Vascular Surgery | Admitting: Vascular Surgery

## 2014-09-19 VITALS — BP 134/78 | HR 66 | Ht 64.0 in | Wt 159.2 lb

## 2014-09-19 DIAGNOSIS — I6350 Cerebral infarction due to unspecified occlusion or stenosis of unspecified cerebral artery: Secondary | ICD-10-CM | POA: Diagnosis not present

## 2014-09-19 DIAGNOSIS — I6523 Occlusion and stenosis of bilateral carotid arteries: Secondary | ICD-10-CM

## 2014-09-19 NOTE — Progress Notes (Signed)
[facility] [place]  Patient name: [name] [name] MRN: [id number] DOB: [birth year] Sex: male  REASON FOR VISIT: Carotid disease  HPI: [name] [name] is a 64 y.o. male had a left brain stroke in the past. Carotid duplex can suggested a moderate carotid stenosis on the left and a tight right carotid stenosis. CT angiogram suggested a string sign on the right with no significant stenosis on the left. This reason, given the discrepancy, he underwent a cerebral arteriogram on 03/19/2014. This showed no significant disease of the aortic arch. On the right side, which was the asymptomatic side, there was a 70% smooth carotid stenosis. Of note the patient has had previous surgery on the right side of the neck and also radiation therapy. On the left side, which was the symptomatic side it was no significant stenosis. There was slight irregularity but minimal plaque.  Since I saw him last, he denies any history of stroke, TIAs, expressive or receptive aphasia, or amaurosis fugax. He is on Plavix. He does not take aspirin because he is on Plavix. He is on a statin.    Past Medical History  Diagnosis Date  . Hyperlipemia   . Cancer     throat, lymph node  . GERD (gastroesophageal reflux disease)   . PVD (peripheral vascular disease)   . Carotid artery occlusion   . Stroke    Family History  Problem Relation Age of Onset  . Lung cancer Mother   . Cancer Mother   . CAD Father   . Heart disease Father    SOCIAL HISTORY: History  Substance Use Topics  . Smoking status: Former Smoker    Quit date: 03/14/2001  . Smokeless tobacco: Never Used  . Alcohol Use: No   No Known Allergies Current Outpatient Prescriptions  Medication Sig Dispense Refill  . atorvastatin (LIPITOR) 20 MG tablet Take 20 mg by mouth daily.    . clopidogrel (PLAVIX) 75 MG tablet Take 1 tablet (75 mg total) by mouth daily. 30 tablet 1  . omeprazole (PRILOSEC) 10 MG capsule Take 10 mg by mouth daily.      No current facility-administered medications for this visit.   REVIEW OF SYSTEMS: Valu.Nieves ] denotes positive finding; [  ] denotes negative finding  CARDIOVASCULAR:  [ ]  chest pain   [ ]  chest pressure   [ ]  palpitations   [ ]  orthopnea   [ ]  dyspnea on exertion   Valu.Nieves ] claudication   [ ]  rest pain   [ ]  DVT   [ ]  phlebitis PULMONARY:   [ ]  productive cough   [ ]  asthma   [ ]  wheezing NEUROLOGIC:   [ ]  weakness  [ ]  paresthesias  [ ]  aphasia  [ ]  amaurosis  [ ]  dizziness HEMATOLOGIC:   [ ]  bleeding problems   [ ]  clotting disorders MUSCULOSKELETAL:  [ ]  joint pain   [ ]  joint swelling [ ]  leg swelling GASTROINTESTINAL: [ ]   blood in stool  [ ]   hematemesis GENITOURINARY:  [ ]   dysuria  [ ]   hematuria PSYCHIATRIC:  [ ]  history of major depression INTEGUMENTARY:  [ ]  rashes  [ ]  ulcers CONSTITUTIONAL:  [ ]  fever   [ ]  chills  PHYSICAL EXAM: Filed Vitals:   09/19/14 1343 09/19/14 1344  BP: 136/69 134/78  Pulse: 66   Height: 5\' 4"  (1.626 m)   Weight: 159 lb 3.2 oz (72.213 kg)   SpO2: 100%  Body mass index is 27.31 kg/(m^2). GENERAL: The patient is a well-nourished male, in no acute distress. The vital signs are documented above. CARDIOVASCULAR: There is a regular rate and rhythm. I do not detect carotid bruits. PULMONARY: There is good air exchange bilaterally without wheezing or rales. ABDOMEN: Soft and non-tender with normal pitched bowel sounds.  MUSCULOSKELETAL: There are no major deformities or cyanosis. NEUROLOGIC: No focal weakness or paresthesias are detected. SKIN: There are no ulcers or rashes noted. PSYCHIATRIC: The patient has a normal affect.  DATA:  I have independent only interpreted his carotid duplex scan. On the left side, he has a 40-59% carotid stenosis. On the right side there is a 60-79% stenosis which would fit with his previous cerebral arteriogram that was done in September. As noted these had previous surgery and radiation. He on the right side of his  neck.  MEDICAL ISSUES: BILATERAL CAROTID DISEASE: He has a 60-79% right carotid stenosis which is stable. We would not consider right carotid endarterectomy unless the stenosis progressed to greater than 80%. If he did have significant progression of the disease on the right he would have to be considered for carotid stenting given his previous surgery in the right neck and previous radiation therapy. On the left side he has a 40-59% stenosis. I have ordered a follow carotid duplex scan in 6 months and he will see our nurse practitioner, Vinnie Level Nickel back at that time. In the meantime he remains on Plavix and is also on a statin.   Return in about 6 months (around 03/22/2015).   [place] [facility] [place] Beeper: [phone]

## 2014-09-20 ENCOUNTER — Other Ambulatory Visit: Payer: Self-pay | Admitting: *Deleted

## 2014-09-20 DIAGNOSIS — I6523 Occlusion and stenosis of bilateral carotid arteries: Secondary | ICD-10-CM

## 2015-01-29 ENCOUNTER — Encounter: Payer: Self-pay | Admitting: Neurology

## 2015-01-29 ENCOUNTER — Ambulatory Visit (INDEPENDENT_AMBULATORY_CARE_PROVIDER_SITE_OTHER): Payer: Medicare Other | Admitting: Neurology

## 2015-01-29 VITALS — BP 142/75 | HR 70 | Ht 64.0 in | Wt 156.0 lb

## 2015-01-29 DIAGNOSIS — I6521 Occlusion and stenosis of right carotid artery: Secondary | ICD-10-CM

## 2015-01-29 DIAGNOSIS — I639 Cerebral infarction, unspecified: Secondary | ICD-10-CM

## 2015-01-29 DIAGNOSIS — I739 Peripheral vascular disease, unspecified: Secondary | ICD-10-CM

## 2015-01-29 DIAGNOSIS — I6350 Cerebral infarction due to unspecified occlusion or stenosis of unspecified cerebral artery: Secondary | ICD-10-CM

## 2015-01-29 NOTE — Progress Notes (Signed)
STROKE NEUROLOGY FOLLOW UP NOTE  NAME: [name] [name] DOB: 27-Jul-1950  REASON FOR VISIT: stroke follow up HISTORY FROM: pt and chart  Today we had the pleasure of seeing [name] [name] in follow-up at our [facility]rology [facility]. Pt was accompanied by no one.   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 MRI showed acute deep white matter infarct at the left centrum semiovale. 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 His right-sided weakness improved during hospitalization and he was discharged with aspirin and Plavix as well as Lipitor for stroke prevention. He came in today for stroke follow up. He was following with Dr. Scot Dock for the right lower extremity peripheral vascular disease until June 2011, but he had not been seen recently. He was also following with Dr. 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 He had a primary care doctor in [place], which he visited up to the hospitalization and was refilled with aspirin and Plavix. The next visit will be in February 2016. He quit smoking in 2002 before that he had a smoked for 40 years with 1-1.5 pack per day. He also quit cup for drinking in 2002.  02/14/14 follow up - the patient has been doing  stable.  He stated that his right-sided weakness largely resolved. However, he still has difficulty with drinking and eating most of the time because of choking. He falls with speech pathologist as outpatient for speech therapy but he didn't mention choking during the session.  04/25/14 follow up - pt  was stable. No new neurological symptoms. He still has problem with swallowing and intermittent choking. Has not seen swallow yet. He saw Dr. Scot Dock about carotid stenosis and had cerebral angiogram done showed asymptomatic right 70% stenosis and left no significant stenosis. No intervention needed. He will follow up with Dr. Scot Dock for CUS in 6 months. He has taken dural antiplatelet for 3 months now, will switch to plavix alone.  07/31/14 follow up - patient was doing well. No recurrent neurological symptoms. Has passed swallow study and on regular diet with thin liquid now. He had TCD emboli detection for 3 minutes which showed no emboli detected. TCD VMR study not conclusive. His blood pressure was 138/69 today  Interval History During the interval time, pt has been doing well. Still has drooling, but able to swallow. Just occasionally he has some cough with chocolate. He followed up with Dr. Scot Dock in 09/2014 and repeat CUS showed stable right stenosis with 60-79% stenosis but progression on left ICA stenosis with 40-59%. Plan to repeat CUS in 6 months. I would check to see if he is a candidate for CREST II trial. BP 142/75 today in clinic.  REVIEW OF SYSTEMS: Full 14 system review of systems performed and notable only for those listed below and in HPI above, all others are negative:  Constitutional: N/A  Cardiovascular: N/A  Ear/Nose/Throat: drooling Skin: itching  Eyes: N/A  Respiratory:  N/A Gastroitestinal: N/A  Genitourinary: N/A Hematology/Lymphatic: N/A  Endocrine: N/A  Musculoskeletal: N/A  Allergy/Immunology: N/A  Neurological: speech difficulty  Psychiatric: restless leg   The following represents the patient's updated allergies and side effects list: No Known Allergies  Labs since last visit of relevance include the following: Results for orders placed or performed during the hospital encounter of 03/19/14  I-STAT, chem 8  Result Value Ref Range   Sodium 139 137 - 147  mEq/L   Potassium 3.8 3.7 - 5.3 mEq/L   Chloride 103 96 - 112 mEq/L   BUN 11 6 - 23 mg/dL   Creatinine, Ser 1.40 (H) 0.50 - 1.35 mg/dL   Glucose, Bld 92 70 - 99 mg/dL   Calcium, Ion 1.21 1.13 - 1.30 mmol/L   TCO2 28 0 - 100 mmol/L   Hemoglobin 13.6 13.0 - 17.0 g/dL   HCT 40.0 39.0 - 52.0 %    The neurologically relevant items on the patient's problem list were reviewed on today's visit.  Neurologic Examination  A problem focused neurological exam (12 or more points of the single system neurologic examination, vital signs counts as 1 point, cranial nerves count for 8 points) was performed.  Blood pressure 142/75, pulse 70, height 5\' 4"  (1.626 m), weight 156 lb (70.761 kg).  General - Well nourished, well developed, in no apparent distress.  Ophthalmologic - Sharp disc margins OU.  Cardiovascular - Regular rate and rhythm with no murmur.  Mental Status -  Level of arousal and orientation to time, place, and person were intact. Language including expression, naming, repetition, comprehension was assessed and found intact. Attention span and concentration were normal. Recent and remote memory were 3/3 registration and 2/3 delayed recall. Fund of Knowledge was assessed and was intact.  Cranial Nerves II - XII - II - Visual field intact OU. III, IV, VI - Extraocular movements intact. V - Facial sensation intact bilaterally. VII - Facial movement intact bilaterally. VIII - Hearing & vestibular intact bilaterally. X - Palate elevates symmetrically. XI - Chin turning & shoulder shrug intact bilaterally. XII - Tongue protrusion intact.  Motor Strength - The patient's strength was normal in all extremities and pronator drift was absent.  Bulk was normal and fasciculations were absent.   Motor Tone - Muscle tone was assessed at the neck and appendages and was normal.  Reflexes - The patient's reflexes were normal in all extremities and he had no pathological reflexes.  Sensory -  Light touch, temperature/pinprick and Romberg testing were assessed and were normal.    Coordination - The patient had normal movements in the hands and feet with no ataxia or dysmetria.  Tremor was absent.  Gait and Station - small stride and slow gait but steady.  Data reviewed: I personally reviewed the images and agree with the radiology interpretations.  CT Head Wo Contrast 01/13/2014  1. No acute intracranial findings.  2. No intracranial hemorrhage.  3. White matter infarction in the left corona radiata unchanged from prior.  Mr Brain Wo Contrast  01/13/2014 Acute deep white matter infarct left centrum semiovale. No hemorrhage or mass effect.  CTA of the neck 01/15/14 1. High-grade atherosclerotic stenosis right ICA bulb, radiographic  string sign.  2. No hemodynamically significant cervical left carotid stenosis.  3. Calcified plaque in both ICA siphons incompletely visualized.  4. Mild vertebral artery origin stenosis, with otherwise negative  vertebral arteries.  5. Chronic postoperative changes to the right neck related to remote  tonsillar carcinoma. Increased level 1 lymph nodes since 2008, but  not pathologically enlarged. Still, recommend routine followup ENT  evaluation of these nodes and/or further CT neck imaging  surveillance Carotid Doppler 1-39% right internal carotid artery stenosis, elevated velocities suggest borderline 60-79% left internal stenosis  2D Echocardiogram - ejection fraction 55-60%. No cardiac source of embolism identified.  EKG normal sinus rhythm rate 69 beats per minute. Cerebral angiogram  -  1. No significant disease of the aortic arch. The patient has a bovine arch. The innominate, right common carotid artery, right subclavian artery, and right vertebral artery are patent. The left common carotid artery, left subclavian artery, and left vertebral artery are patent.  2. On the right side, which is the asymptomatic side, there is a smooth 70%  stenosis. The patient of note has had radiation therapy on this side and surgery also.  3. On the left side, which is the symptomatic side, there is no significant stenosis noted. There is slight irregularity but overall the minimal plaque which is noted does not produce any significant ulceration. TCD MES - negative for 43min TCD VMR - no increase of BHI bilaterally. CUS 09/19/14 - right ICA 60-79% and left ICA 40-59% stenosis.   Component     Latest Ref Rng 01/14/2014  Cholesterol     0 - 200 mg/dL 122  Triglycerides     <150 mg/dL 90  HDL     >39 mg/dL 32 (L)  Total CHOL/HDL Ratio      3.8  VLDL     0 - 40 mg/dL 18  LDL (calc)     0 - 99 mg/dL 72  Hemoglobin A1C     <5.7 % 5.4  Mean Plasma Glucose     <117 mg/dL 108    Assessment: As you may recall, he is a 64 y.o. Caucasian male with a diagnosis of  left subcortical stroke. His right-sided weakness almost resolved. He had a CT angiogram of the neck showing left ICA 50% stenosis and the right ICA high grade stenosis. He is on aspirin Plavix and Lipitor now for stroke prevention. He has on dural antiplatelet for about 3 months now and will switch to monotherapy. He is right ICA high-grade stenosis is asymmetric at this moment, most likely related to atherosclerotic changes and also related to his history of radiation at right-sided neck. Dr. Scot Dock had angiogram 03/2014 showed right about 70% and left open, so no intervention at this time. However, on 09/2014, CUS showed right ICA stenosis stable 60-79% but left ICA stenosis progressed to 40-59%. Plan to follow up CUS in 6 months. He passed swallow study.  Plan:  - continue plavix and lipitor for stroke prevention - follow up with Dr. Scot Dock as scheduled - will see if pt is candidate for CREST II trial. - Follow up with your primary care physician in [place] for stroke risk factor modification. Recommend maintain blood pressure goal <130/80, diabetes with hemoglobin A1c goal below 6.5%  and lipids with LDL cholesterol goal below 70 mg/dL.  - check BP at home, BP goal 130-150 due to high grade right ICA stenosis. - follow up in 6 months.  No orders of the defined types were placed in this encounter.     Patient Instructions  - continue plavix and lipitor for stroke prevention - follow up with Dr. Scot Dock as scheduled - will refer you to cardiology to consider CREST II trial. - Follow up with your primary care physician in [place] for stroke risk factor modification. Recommend maintain blood pressure goal <130/80, diabetes with hemoglobin A1c goal below 6.5% and lipids  with LDL cholesterol goal below 70 mg/dL.  - check BP at home, BP goal 130-150. - follow up in 6 months.    Rosalin Hawking, MD PhD [facility] [address] [place], [place] [address] [phone]

## 2015-01-29 NOTE — Patient Instructions (Signed)
-   continue plavix and lipitor for stroke prevention - follow up with Dr. Scot Dock as scheduled - will refer you to cardiology to consider CREST II trial. - Follow up with your primary care physician in [place] for stroke risk factor modification. Recommend maintain blood pressure goal <130/80, diabetes with hemoglobin A1c goal below 6.5% and lipids with LDL cholesterol goal below 70 mg/dL.  - check BP at home, BP goal 130-150. - follow up in 6 months.

## 2015-03-25 ENCOUNTER — Encounter: Payer: Self-pay | Admitting: Family

## 2015-03-27 ENCOUNTER — Encounter: Payer: Self-pay | Admitting: Family

## 2015-03-27 ENCOUNTER — Ambulatory Visit (HOSPITAL_COMMUNITY)
Admission: RE | Admit: 2015-03-27 | Discharge: 2015-03-27 | Disposition: A | Discharge status: Home / Self Care | Payer: Medicare Other | Source: Ambulatory Visit | Attending: Vascular Surgery | Admitting: Vascular Surgery

## 2015-03-27 ENCOUNTER — Ambulatory Visit (INDEPENDENT_AMBULATORY_CARE_PROVIDER_SITE_OTHER): Payer: Medicare Other | Admitting: Family

## 2015-03-27 ENCOUNTER — Other Ambulatory Visit: Payer: Self-pay | Admitting: *Deleted

## 2015-03-27 VITALS — BP 117/67 | HR 54 | Temp 97.1°F | Resp 14 | Ht 64.0 in | Wt 154.0 lb

## 2015-03-27 DIAGNOSIS — I739 Peripheral vascular disease, unspecified: Secondary | ICD-10-CM

## 2015-03-27 DIAGNOSIS — I6350 Cerebral infarction due to unspecified occlusion or stenosis of unspecified cerebral artery: Secondary | ICD-10-CM | POA: Diagnosis not present

## 2015-03-27 DIAGNOSIS — I6523 Occlusion and stenosis of bilateral carotid arteries: Secondary | ICD-10-CM

## 2015-03-27 DIAGNOSIS — I70219 Atherosclerosis of native arteries of extremities with intermittent claudication, unspecified extremity: Secondary | ICD-10-CM

## 2015-03-27 DIAGNOSIS — E785 Hyperlipidemia, unspecified: Secondary | ICD-10-CM | POA: Diagnosis not present

## 2015-03-27 NOTE — Patient Instructions (Addendum)
Stroke Prevention Some medical conditions and behaviors are associated with an increased chance of having a stroke. You may prevent a stroke by making healthy choices and managing medical conditions. HOW CAN I REDUCE MY RISK OF HAVING A STROKE?   Stay physically active. Get at least 30 minutes of activity on most or all days.  Do not smoke. It may also be helpful to avoid exposure to secondhand smoke.  Limit alcohol use. Moderate alcohol use is considered to be:  No more than 2 drinks per day for men.  No more than 1 drink per day for nonpregnant women.  Eat healthy foods. This involves:  Eating 5 or more servings of fruits and vegetables a day.  Making dietary changes that address high blood pressure (hypertension), high cholesterol, diabetes, or obesity.  Manage your cholesterol levels.  Making food choices that are high in fiber and low in saturated fat, trans fat, and cholesterol may control cholesterol levels.  Take any prescribed medicines to control cholesterol as directed by your health care provider.  Manage your diabetes.  Controlling your carbohydrate and sugar intake is recommended to manage diabetes.  Take any prescribed medicines to control diabetes as directed by your health care provider.  Control your hypertension.  Making food choices that are low in salt (sodium), saturated fat, trans fat, and cholesterol is recommended to manage hypertension.  Take any prescribed medicines to control hypertension as directed by your health care provider.  Maintain a healthy weight.  Reducing calorie intake and making food choices that are low in sodium, saturated fat, trans fat, and cholesterol are recommended to manage weight.  Stop drug abuse.  Avoid taking birth control pills.  Talk to your health care provider about the risks of taking birth control pills if you are over 35 years old, smoke, get migraines, or have ever had a blood clot.  Get evaluated for sleep  disorders (sleep apnea).  Talk to your health care provider about getting a sleep evaluation if you snore a lot or have excessive sleepiness.  Take medicines only as directed by your health care provider.  For some people, aspirin or blood thinners (anticoagulants) are helpful in reducing the risk of forming abnormal blood clots that can lead to stroke. If you have the irregular heart rhythm of atrial fibrillation, you should be on a blood thinner unless there is a good reason you cannot take them.  Understand all your medicine instructions.  Make sure that other conditions (such as anemia or atherosclerosis) are addressed. SEEK IMMEDIATE MEDICAL CARE IF:   You have sudden weakness or numbness of the face, arm, or leg, especially on one side of the body.  Your face or eyelid droops to one side.  You have sudden confusion.  You have trouble speaking (aphasia) or understanding.  You have sudden trouble seeing in one or both eyes.  You have sudden trouble walking.  You have dizziness.  You have a loss of balance or coordination.  You have a sudden, severe headache with no known cause.  You have new chest pain or an irregular heartbeat. Any of these symptoms may represent a serious problem that is an emergency. Do not wait to see if the symptoms will go away. Get medical help at once. Call your local emergency services (911 in [place] Do not drive yourself to the hospital. Document Released: 07/30/2004 Document Revised: 11/06/2013 Document Reviewed: 12/23/2012 [employer] Patient Information 2015 [employer]. This information is not intended to replace advice given   to you by your health care provider. Make sure you discuss any questions you have with your health care provider.   Intermittent Claudication Blockage of leg arteries results from poor circulation of blood in the leg arteries. This produces an aching, tired, and sometimes burning pain in the legs that is brought on by  exercise and made better by rest. Claudication refers to the limping that happens from leg cramps. It is also referred to as Vaso-occlusive disease of the legs, arterial insufficiency of the legs, recurrent leg pain, recurrent leg cramping and calf pain with exercise.  CAUSES  This condition is due to narrowing or blockage of the arteries (muscular vessels which carry blood away from the heart and around the body). Blockage of arteries can occur anywhere in the body. If they occur in the heart, a person may experience angina (chest pain) or even a heart attack. If they occur in the neck or the brain, a person may have a stroke. Intermittent claudication is when the blockage occurs in the legs, most commonly in the calf or the foot.  Atherosclerosis, or blockage of arteries, can occur for many reasons. Some of these are smoking, diabetes, and high cholesterol. SYMPTOMS  Intermittent claudication may occur in both legs, and it often continues to get worse over time. However, some people complain only of weakness in the legs when walking, or a feeling of "tiredness" in the buttocks. Impotence (not able to have an erection) is an occasional complaint in men. Pain while resting is uncommon.  WHAT TO EXPECT AT YOUR HEALTH CARE PROVIDER'S OFFICE: Your medical history will be asked for and a physical examination will be performed. Medical history questions documenting claudication in detail may include:   Time pattern  Do you have leg cramps at night (nocturnal cramps)?  How often does leg pain with cramping occur?  Is it getting worse?  What is the quality of the pain?  Is the pain sharp?  Is there an aching pain with the cramps?  Aggravating factors  Is it worse after you exercise?  Is it worse after you are standing for a while?  Do you smoke? How much?  Do you drink alcohol? How much?  Are you diabetic? How well is your blood sugar controlled?  Other  What other symptoms are also  present?  Has there been impotence (men)?  Is there pain in the back?  Is there a darkening of the skin of the legs, feet or toes?  Is there weakness or paralysis of the legs? The physical examination may include evaluation of the femoral pulse (in the groin) and the other areas where the pulse can be felt in the legs. DIAGNOSIS  Diagnostic tests that may be performed include:  Blood pressure measured in arms and legs for comparison.  Doppler ultrasonography on the legs and the heart.  Duplex Doppler/ultrasound exam of extremity to visualize arterial blood flow.  ECG- to evaluate the activity of your heart.  Aortography- to visualize blockages in your arteries. TREATMENT Surgical treatment may be suggested if claudication interferes with the patient's activities or work, and if the diseased arteries do not seem to be improving after treatment. Be aware that this condition can worsen over time and you should carefully monitor your condition. HOME CARE INSTRUCTIONS  Talk to your caregiver about the cause of your leg cramping and about what to do at home to relieve it.  A healthy diet is important to lessen the likeliness of atherosclerosis.    A program of daily walking for short periods, and stopping for pain or cramping, may help improve function.  It is important to stop smoking.  Avoid putting hot or cold items on legs.  Avoid tight shoes. SEEK MEDICAL CARE IF: There are many other causes of leg pain such as arthritis or low blood potassium. However, some causes of leg pain may be life threatening such as a blood clot in the legs. Seek medical attention if you have:  Leg pain that does not go away.  Legs that may be red, hot or swollen.  Ulcers or sores appear on your ankle or foot.  Any chest pain or shortness of breath accompanying leg pain.  Diabetes.  You are pregnant. SEEK IMMEDIATE MEDICAL CARE IF:   Your leg pain becomes severe or will not go away.  Your  foot turns blue or a dark color.  Your leg becomes red, hot or swollen or you develop a fever over 102F.  Any chest pain or shortness of breath accompanying leg pain. MAKE SURE YOU:   Understand these instructions.  Will watch your condition.  Will get help right away if you are not doing well or get worse. Document Released: 04/24/2004 Document Revised: 09/14/2011 Document Reviewed: 09/28/2013 Exit[employer] Patient Information 2015 [employer]. This information is not intended to replace advice given to you by your health care provider. Make sure you discuss any questions you have with your health care provider.     

## 2015-03-27 NOTE — Progress Notes (Signed)
Established Carotid Patient   History of Present Illness  [name] [name] is a 64 y.o. male patient of Dr. 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 Carotid duplex suggested a moderate carotid stenosis on the left and a tight right carotid stenosis. CT angiogram suggested a string sign on the right with no significant stenosis on the left. This reason, given the discrepancy, he underwent a cerebral arteriogram on 03/19/2014. This showed no significant disease of the aortic arch. On the right side, which was the asymptomatic side, there was a 70% smooth carotid stenosis. Of note the patient has had previous surgery on the right side of the neck and also radiation therapy. On the left side, which was the symptomatic side it was no significant stenosis. There was slight irregularity but minimal plaque. Pt denies any subsequent stroke or TIA.  Pt also has a history of redo of right femoral-popliteal artery bypass graft and tibial vessel thrombectomy on 10/19/2002 by Dr. Scot Dock.  He has left calf claudication after walking about 5 minutes, relieved by a few minutes of rest, denies non healing wounds.  Last ABI result on file was 10/31/09, right was 0.92, left was 0.40.  He is on Plavix. He does not take aspirin because he is on Plavix. He is on a statin.   Dr. Scot Dock last saw pt on 09/19/14. At that time Dr. Scot Dock indicated in his assessment that we would not consider right carotid endarterectomy unless the stenosis progressed to greater than 80%. If he did have significant progression of the disease on the right he would have to be considered for carotid stenting given his previous surgery in the right neck and previous radiation therapy. On the left side he has a 40-59% stenosis. Pt returns today for 6 months follow up surveillance of his carotid arteries.   Patient has not had previous carotid artery intervention.  Pt states he was told by his PCP at the [facility] that he should not take NSAID's as he has a problem with vessels leading to his kidneys as found on an ultrasound. Pt's neurologist is Dr. Erlinda Hong.  The patient denies New Medical or Surgical History.  Pt Diabetic: no Pt smoker: former smoker, quit in 2002  Pt meds include: Statin : yes ASA: no Other anticoagulants/antiplatelets: Plavix   Past Medical History  Diagnosis Date  . Hyperlipemia   . Cancer     throat, lymph node  . GERD (gastroesophageal reflux disease)   . PVD (peripheral vascular disease)   . Carotid artery occlusion   . Stroke     Social History Social History  Substance Use Topics  . Smoking status: Former Smoker    Quit date: 03/14/2001  . Smokeless tobacco: Never Used  . Alcohol Use: No    Family History Family History  Problem Relation Age of Onset  . Lung cancer Mother   . Cancer Mother   . CAD Father   . Heart disease Father     Surgical History Past Surgical History  Procedure Laterality Date  . Cholecystectomy    . Lymphadenectomy Right 2009    neck  . Pr vein bypass graft,aorto-fem-pop    . Cerebral angiogram N/A 03/19/2014    Procedure: CEREBRAL ANGIOGRAM;  Surgeon: Angelia Mould, MD;  Location: [facility] CATH LAB;  Service: Cardiovascular;  Laterality: N/A;  . Arch aortogram N/A 03/19/2014    Procedure: ARCH AORTOGRAM;  Surgeon: Angelia Mould, MD;  Location: [facility] CATH LAB;  Service: Cardiovascular;  Laterality: N/A;    No Known Allergies  Current Outpatient Prescriptions  Medication Sig Dispense Refill  . atorvastatin (LIPITOR) 20 MG tablet Take 20 mg by mouth [name].    . clopidogrel (PLAVIX) 75 MG tablet Take 1 tablet (75 mg total) by mouth [name]. 30 tablet 1  . omeprazole (PRILOSEC) 10 MG capsule Take 10 mg by mouth [name].     No current facility-administered medications for this visit.    Review of Systems : See HPI for pertinent positives and negatives.  Physical Examination  Filed Vitals:   03/27/15  1517 03/27/15 1518  BP: 112/66 117/67  Pulse: 64 54  Temp: 97.1 F (36.2 C)   Resp: 14   Height: 5\' 4"  (1.626 m)   Weight: 154 lb (69.854 kg)   SpO2: 95%    Body mass index is 26.42 kg/(m^2).  General: WDWN male in NAD GAIT: slightly stilted Eyes: PERRLA Pulmonary:  Non-labored, CTAB, no rales, no rhonchi, & no wheezing.  Cardiac: regular rhythm, no detected murmur.  VASCULAR EXAM Carotid Bruits Right Left   Negative Negative    Aorta is not palpable. Radial pulses are 2+ palpable and equal.                                                                                                                            LE Pulses Right Left       FEMORAL  3+ palpable  1+ palpable        POPLITEAL  not palpable   not palpable       POSTERIOR TIBIAL  not palpable   not palpable        DORSALIS PEDIS      ANTERIOR TIBIAL 1+ palpable  not palpable     Gastrointestinal: soft, nontender, BS WNL, no r/g,  no palpable masses.  Musculoskeletal: No muscle atrophy/wasting. M/S 5/5 throughout except 4/5 in left LE, extremities without ischemic changes.  Neurologic: A&O X 3; Appropriate Affect, Speech is mostly fluent, slightly slurred, CN 2-12 intact, pain and light touch intact in extremities, Motor exam as listed above.   Non-Invasive Vascular Imaging CAROTID DUPLEX 03/27/2015   CEREBROVASCULAR DUPLEX EVALUATION    INDICATION: Carotid artery disease    PREVIOUS INTERVENTION(S): None    DUPLEX EXAM: Carotid duplex    RIGHT  LEFT  Peak Systolic Velocities (cm/s) End Diastolic Velocities (cm/s) Plaque LOCATION Peak Systolic Velocities (cm/s) End Diastolic Velocities (cm/s) Plaque  84 21 - CCA PROXIMAL 103 21 HT  62 13 - CCA MID 70 14 HM  49 11 HT CCA DISTAL 54 14 HT  217 25 HT ECA 303 43 HT  303 112 CP ICA PROXIMAL 63 24 CP  197 67 - ICA MID 136 44 HT  200 49 - ICA DISTAL 116 38 -    4.8 ICA / CCA Ratio (PSV) .96  Antegrade Vertebral Flow Antegrade  - Brachial  Systolic Pressure (mmHg) -  Triphasic Brachial Artery Waveforms Triphasic    Plaque Morphology:  HM = Homogeneous, HT = Heterogeneous, CP = Calcific Plaque, SP = Smooth Plaque, IP = Irregular Plaque  ADDITIONAL FINDINGS:     IMPRESSION: 1. 60 - 79% right internal carotid artery stenosis, higher end of range, calcific plaque may obscure higher velocity 2. Bilateral external carotid artery stenosis. 3. Less than 40% proximal internal carotid artery stenosis, increased velocity in the mid internal carotid artery with plaque visualized.    Compared to the previous exam:  Increase of velocity on the right     Assessment: [name] [name] is a 64 y.o. male who had a left brain stroke in July 2015 as manifested by slurred speech.  Dr. Scot Dock indicated in his 09/19/14 assessment that we would not consider right carotid endarterectomy unless the stenosis progressed to greater than 80%. If he did have significant progression of the disease on the right he would have to be considered for carotid stenting given his previous surgery in the right neck and previous radiation therapy.   Today's carotid duplex suggests 60 - 79% right internal carotid artery stenosis, higher end of range, calcific plaque may obscure higher velocity, and less than 40% proximal internal carotid artery stenosis. I discussed today's carotid duplex results with Dr. Scot Dock, pt has had no subsequent stroke or TIA's.   Pt also has intermittent claudication in his left calf, not life limiting, he has no signs of ischemia in his lower extremities. He has a history of redo of right femoral-popliteal artery bypass graft and tibial vessel thrombectomy on 10/19/2002 by Dr. Scot Dock.   Face to face time with patient was 25 minutes. Over 50% of this time was spent on counseling and coordination of care.   Plan: Follow-up in 6 months with Carotid Duplex scan and ABI's.   I discussed in depth with the patient the nature of atherosclerosis, and  emphasized the importance of maximal medical management including strict control of blood pressure, blood glucose, and lipid levels, obtaining regular exercise, and continued cessation of smoking.  The patient is aware that without maximal medical management the underlying atherosclerotic disease process will progress, limiting the benefit of any interventions. The patient was given information about stroke prevention and what symptoms should prompt the patient to seek immediate medical care. Thank you for allowing [place] to participate in this patient's care.  Clemon Chambers, RN, MSN, FNP-C [facility] Office: [phone]  Clinic Physician: Scot Dock  03/27/2015 3:33 PM

## 2015-04-02 DIAGNOSIS — Z23 Encounter for immunization: Secondary | ICD-10-CM | POA: Diagnosis not present

## 2015-08-01 ENCOUNTER — Encounter: Payer: Self-pay | Admitting: Neurology

## 2015-08-01 ENCOUNTER — Ambulatory Visit (INDEPENDENT_AMBULATORY_CARE_PROVIDER_SITE_OTHER): Payer: Medicare Other | Admitting: Neurology

## 2015-08-01 VITALS — BP 116/66 | HR 68 | Ht 64.0 in | Wt 157.0 lb

## 2015-08-01 DIAGNOSIS — I63312 Cerebral infarction due to thrombosis of left middle cerebral artery: Secondary | ICD-10-CM | POA: Diagnosis not present

## 2015-08-01 DIAGNOSIS — I6521 Occlusion and stenosis of right carotid artery: Secondary | ICD-10-CM

## 2015-08-01 DIAGNOSIS — I739 Peripheral vascular disease, unspecified: Secondary | ICD-10-CM

## 2015-08-01 NOTE — Progress Notes (Signed)
STROKE NEUROLOGY FOLLOW UP NOTE  NAME: [name] [name] DOB: Jun 03, 1951  REASON FOR VISIT: stroke follow up HISTORY FROM: pt and chart  Today we had the pleasure of seeing [name] in follow-up at our Neurology Clinic. Pt was accompanied by no one.   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 MRI showed acute deep white matter infarct at the left centrum semiovale. 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 His right-sided weakness improved during hospitalization and he was discharged with aspirin and Plavix as well as Lipitor for stroke prevention. He came in today for stroke follow up. He was following with Dr. Scot Dock for the right lower extremity peripheral vascular disease until June 2011, but he had not been seen recently. He was also following with Dr. 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 He had a primary care doctor in [place], which he visited up to the hospitalization and was refilled with aspirin and Plavix. The next visit will be in February 2016. He quit smoking in 2002 before that he had a smoked for 40 years with 1-1.5 pack per day. He also quit cup for drinking in 2002.  02/14/14 follow up - the patient has been doing  stable.  He stated that his right-sided weakness largely resolved. However, he still has difficulty with drinking and eating most of the time because of choking. He falls with speech pathologist as outpatient for speech therapy but he didn't mention choking during the session.  04/25/14 follow up - pt  was stable. No new neurological symptoms. He still has problem with swallowing and intermittent choking. Has not seen swallow yet. He saw Dr. Scot Dock about carotid stenosis and had cerebral angiogram done showed asymptomatic right 70% stenosis and left no significant stenosis. No intervention needed. He will follow up with Dr. Scot Dock for CUS in 6 months. He has taken dural antiplatelet for 3 months now, will switch to plavix alone.  07/31/14 follow up - patient was doing well. No recurrent neurological symptoms. Has passed swallow study and on regular diet with thin liquid now. He had TCD emboli detection for 3 minutes which showed no emboli detected. TCD VMR study not conclusive. His blood pressure was 138/69 today  01/29/15 follow up - pt has been doing well. Still has drooling, but able to swallow. Just occasionally he has some cough with chocolate. He followed up with Dr. Scot Dock in 09/2014 and repeat CUS showed stable right stenosis with 60-79% stenosis but progression on left ICA stenosis with 40-59%. Plan to repeat CUS in 6 months. I would check to see if he is a candidate for CREST II trial. BP 142/75 today in clinic.  Interval History During the interval time, pt has been doing well. No recurrent stroke like symptoms. Swallow much better and no significant issue now. He follows with Dr. Scot Dock in 03/2015, and CUS showed stable right ICA 60-79% stenosis, will repeat CUS in 09/2015. If stenosis more than 80%, Dr. Scot Dock is going to do right ICA stenting. Continued on plavxi  and lipitor. BP stable today 116/66. No complains.  REVIEW OF SYSTEMS: Full 14 system review of systems performed and notable only for those listed below and in HPI above, all others are negative:  Constitutional: N/A  Cardiovascular: N/A  Ear/Nose/Throat: Skin:   Eyes: N/A  Respiratory:  N/A Gastroitestinal: N/A  Genitourinary: N/A Hematology/Lymphatic: N/A  Endocrine: N/A  Musculoskeletal: N/A  Allergy/Immunology: N/A    Neurological:   Psychiatric:   The following represents the patient's updated allergies and side effects list: No Known Allergies  Labs since last visit of relevance include the following: Results for orders placed or performed during the hospital encounter of 03/19/14  I-STAT, chem 8  Result Value Ref Range   Sodium 139 137 - 147 mEq/L   Potassium 3.8 3.7 - 5.3 mEq/L   Chloride 103 96 - 112 mEq/L   BUN 11 6 - 23 mg/dL   Creatinine, Ser 1.40 (H) 0.50 - 1.35 mg/dL   Glucose, Bld 92 70 - 99 mg/dL   Calcium, Ion 1.21 1.13 - 1.30 mmol/L   TCO2 28 0 - 100 mmol/L   Hemoglobin 13.6 13.0 - 17.0 g/dL   HCT 40.0 39.0 - 52.0 %    The neurologically relevant items on the patient's problem list were reviewed on today's visit.  Neurologic Examination  A problem focused neurological exam (12 or more points of the single system neurologic examination, vital signs counts as 1 point, cranial nerves count for 8 points) was performed.  Blood pressure 116/66, pulse 68, height 5\' 4"  (1.626 m), weight 157 lb (71.215 kg).  General - Well nourished, well developed, in no apparent distress.  Ophthalmologic - Sharp disc margins OU.  Cardiovascular - Regular rate and rhythm with no murmur.  Mental Status -  Level of arousal and orientation to time, place, and person were intact. Language including expression, naming, repetition, comprehension was assessed and found intact. Attention span and concentration were normal. Recent and remote memory were 3/3 registration and 2/3 delayed recall. Fund of Knowledge was assessed and was intact.  Cranial Nerves II - XII - II - Visual field intact OU. III, IV, VI - Extraocular movements intact. V - Facial sensation intact bilaterally. VII - Facial movement intact bilaterally. VIII - Hearing & vestibular intact bilaterally. X - Palate elevates symmetrically. XI - Chin turning & shoulder shrug intact bilaterally. XII - Tongue protrusion intact.  Motor  Strength - The patient's strength was normal in all extremities and pronator drift was absent.  Bulk was normal and fasciculations were absent.   Motor Tone - Muscle tone was assessed at the neck and appendages and was normal.  Reflexes - The patient's reflexes were normal in all extremities and he had no pathological reflexes.  Sensory - Light touch, temperature/pinprick and Romberg testing were assessed and were normal.    Coordination - The patient had normal movements in the hands and feet with no ataxia or dysmetria.  Tremor was absent.  Gait and Station - small stride and slow gait but steady.   Data reviewed: I personally reviewed the images and agree with the radiology interpretations.  CT Head Wo Contrast 01/13/2014  1. No acute intracranial findings.  2. No intracranial hemorrhage.  3. White matter infarction in the left corona radiata unchanged from prior.  Mr Brain Wo Contrast  01/13/2014 Acute deep white matter infarct left centrum semiovale. No hemorrhage or mass effect.  CTA of the neck 01/15/14 1. High-grade atherosclerotic stenosis right ICA bulb, radiographic  string sign.  2. No hemodynamically significant cervical left carotid stenosis.  3. Calcified plaque in both ICA siphons incompletely visualized.  4. Mild vertebral artery origin stenosis, with otherwise negative  vertebral arteries.  5. Chronic postoperative changes to the right neck related to remote  tonsillar carcinoma. Increased level 1 lymph nodes since 2008, but  not pathologically enlarged. Still, recommend routine followup ENT  evaluation of these nodes and/or further CT neck imaging  surveillance Carotid Doppler 1-39% right internal carotid artery stenosis, elevated velocities suggest borderline 60-79% left internal stenosis  2D Echocardiogram - ejection fraction 55-60%. No cardiac source of embolism identified.  EKG normal sinus rhythm rate 69 beats per minute. Cerebral angiogram  -  1. No  significant disease of the aortic arch. The patient has a bovine arch. The innominate, right common carotid artery, right subclavian artery, and right vertebral artery are patent. The left common carotid artery, left subclavian artery, and left vertebral artery are patent.  2. On the right side, which is the asymptomatic side, there is a smooth 70% stenosis. The patient of note has had radiation therapy on this side and surgery also.  3. On the left side, which is the symptomatic side, there is no significant stenosis noted. There is slight irregularity but overall the minimal plaque which is noted does not produce any significant ulceration. TCD MES - negative for 33min TCD VMR - no increase of BHI bilaterally. CUS 09/19/14 - right ICA 60-79% and left ICA 40-59% stenosis. CUS 03/27/15 - right ICA 60-79% and left ICA < 40% stenosis.   Component     Latest Ref Rng 01/14/2014  Cholesterol     0 - 200 mg/dL 122  Triglycerides     <150 mg/dL 90  HDL     >39 mg/dL 32 (L)  Total CHOL/HDL Ratio      3.8  VLDL     0 - 40 mg/dL 18  LDL (calc)     0 - 99 mg/dL 72  Hemoglobin A1C     <5.7 % 5.4  Mean Plasma Glucose     <117 mg/dL 108    Assessment: As you may recall, he is a 65 y.o. Caucasian male with a diagnosis of  left subcortical stroke. His right-sided weakness almost resolved. He had a CT angiogram of the neck showing left ICA 50% stenosis and the right ICA high grade stenosis. He is on aspirin Plavix and Lipitor now for stroke prevention. He has on dural antiplatelet for about 3 months now and will switch to monotherapy. He is right ICA high-grade stenosis is asymmetric at this moment, most likely related to atherosclerotic changes and also related to his history of radiation at right-sided neck. Dr. Scot Dock had angiogram 03/2014 showed right about 70% and left open, so no intervention at this time. However, on 09/2014, CUS showed right ICA stenosis stable 60-79% but left ICA stenosis progressed  to 40-59%. Repeat CUS in 03/27/15 showed right ICA 60-79% and left ICA < 40% stenosis. Plan to follow up CUS in 09/2015. If right ICA > 80%, will have right ICA stenting.  Plan:  - continue plavix and lipitor for stroke prevention - follow up with Dr. Scot Dock as scheduled - agree to consider carotid stent if right ICA > 80% - Follow up with your primary care physician in [place] for stroke risk factor modification. Recommend maintain blood pressure goal <130/80, diabetes with hemoglobin A1c goal below 6.5% and lipids with LDL cholesterol goal below 70 mg/dL.  -  check BP at home, BP goal 130-150 due to high grade right ICA stenosis.  - follow up as needed.  No orders of the defined types were placed in this encounter.     Patient Instructions  - continue plavix and lipitor for stroke prevention - follow up with Dr. Scot Dock as scheduled - agree to consider carotid stent if right ICA more than 80% - Follow up with your primary care physician in [place] for stroke risk factor modification. Recommend maintain blood pressure goal <130/80, diabetes with hemoglobin A1c goal below 6.5% and lipids with LDL cholesterol goal below 70 mg/dL.  - check BP at home, BP goal 130-150 due to high grade right ICA stenosis.  - follow up as needed.    Rosalin Hawking, MD PhD [facility] [address] [place], [place] [address] [phone]

## 2015-08-01 NOTE — Patient Instructions (Addendum)
-   continue plavix and lipitor for stroke prevention - follow up with Dr. Scot Dock as scheduled - agree to consider carotid stent if right ICA more than 80% - Follow up with your primary care physician in [place] for stroke risk factor modification. Recommend maintain blood pressure goal <130/80, diabetes with hemoglobin A1c goal below 6.5% and lipids with LDL cholesterol goal below 70 mg/dL.  - check BP at home, BP goal 130-150 due to high grade right ICA stenosis.  - follow up as needed.

## 2015-09-18 ENCOUNTER — Encounter: Payer: Self-pay | Admitting: Family

## 2015-09-23 ENCOUNTER — Ambulatory Visit (INDEPENDENT_AMBULATORY_CARE_PROVIDER_SITE_OTHER)
Admission: RE | Admit: 2015-09-23 | Discharge: 2015-09-23 | Disposition: A | Discharge status: Home / Self Care | Payer: Medicare Other | Source: Ambulatory Visit | Attending: Family | Admitting: Family

## 2015-09-23 ENCOUNTER — Ambulatory Visit (HOSPITAL_COMMUNITY)
Admission: RE | Admit: 2015-09-23 | Discharge: 2015-09-23 | Disposition: A | Discharge status: Home / Self Care | Payer: Medicare Other | Source: Ambulatory Visit | Attending: Surgery | Admitting: Surgery

## 2015-09-23 ENCOUNTER — Other Ambulatory Visit: Payer: Self-pay | Admitting: Vascular Surgery

## 2015-09-23 DIAGNOSIS — I6523 Occlusion and stenosis of bilateral carotid arteries: Secondary | ICD-10-CM | POA: Diagnosis not present

## 2015-09-23 DIAGNOSIS — R938 Abnormal findings on diagnostic imaging of other specified body structures: Secondary | ICD-10-CM | POA: Insufficient documentation

## 2015-09-23 DIAGNOSIS — I739 Peripheral vascular disease, unspecified: Secondary | ICD-10-CM

## 2015-09-23 DIAGNOSIS — E785 Hyperlipidemia, unspecified: Secondary | ICD-10-CM | POA: Insufficient documentation

## 2015-09-23 DIAGNOSIS — K219 Gastro-esophageal reflux disease without esophagitis: Secondary | ICD-10-CM | POA: Insufficient documentation

## 2015-09-25 ENCOUNTER — Encounter (HOSPITAL_COMMUNITY): Payer: Medicare Other

## 2015-09-25 ENCOUNTER — Encounter: Payer: Self-pay | Admitting: Family

## 2015-09-25 ENCOUNTER — Ambulatory Visit (INDEPENDENT_AMBULATORY_CARE_PROVIDER_SITE_OTHER): Payer: Medicare Other | Admitting: Family

## 2015-09-25 VITALS — BP 112/70 | HR 70 | Temp 97.9°F | Resp 14 | Ht 64.0 in | Wt 159.0 lb

## 2015-09-25 DIAGNOSIS — I6523 Occlusion and stenosis of bilateral carotid arteries: Secondary | ICD-10-CM | POA: Diagnosis not present

## 2015-09-25 DIAGNOSIS — I6521 Occlusion and stenosis of right carotid artery: Secondary | ICD-10-CM | POA: Diagnosis not present

## 2015-09-25 DIAGNOSIS — Z95828 Presence of other vascular implants and grafts: Secondary | ICD-10-CM

## 2015-09-25 DIAGNOSIS — Z923 Personal history of irradiation: Secondary | ICD-10-CM | POA: Diagnosis not present

## 2015-09-25 DIAGNOSIS — I70212 Atherosclerosis of native arteries of extremities with intermittent claudication, left leg: Secondary | ICD-10-CM

## 2015-09-25 NOTE — Patient Instructions (Addendum)
Stroke Prevention Some medical conditions and behaviors are associated with an increased chance of having a stroke. You may prevent a stroke by making healthy choices and managing medical conditions. HOW CAN I REDUCE MY RISK OF HAVING A STROKE?   Stay physically active. Get at least 30 minutes of activity on most or all days.  Do not smoke. It may also be helpful to avoid exposure to secondhand smoke.  Limit alcohol use. Moderate alcohol use is considered to be:  No more than 2 drinks per day for men.  No more than 1 drink per day for nonpregnant women.  Eat healthy foods. This involves:  Eating 5 or more servings of fruits and vegetables a day.  Making dietary changes that address high blood pressure (hypertension), high cholesterol, diabetes, or obesity.  Manage your cholesterol levels.  Making food choices that are high in fiber and low in saturated fat, trans fat, and cholesterol may control cholesterol levels.  Take any prescribed medicines to control cholesterol as directed by your health care provider.  Manage your diabetes.  Controlling your carbohydrate and sugar intake is recommended to manage diabetes.  Take any prescribed medicines to control diabetes as directed by your health care provider.  Control your hypertension.  Making food choices that are low in salt (sodium), saturated fat, trans fat, and cholesterol is recommended to manage hypertension.  Ask your health care provider if you need treatment to lower your blood pressure. Take any prescribed medicines to control hypertension as directed by your health care provider.  If you are 18-39 years of age, have your blood pressure checked every 3-5 years. If you are 40 years of age or older, have your blood pressure checked every year.  Maintain a healthy weight.  Reducing calorie intake and making food choices that are low in sodium, saturated fat, trans fat, and cholesterol are recommended to manage  weight.  Stop drug abuse.  Avoid taking birth control pills.  Talk to your health care provider about the risks of taking birth control pills if you are over 35 years old, smoke, get migraines, or have ever had a blood clot.  Get evaluated for sleep disorders (sleep apnea).  Talk to your health care provider about getting a sleep evaluation if you snore a lot or have excessive sleepiness.  Take medicines only as directed by your health care provider.  For some people, aspirin or blood thinners (anticoagulants) are helpful in reducing the risk of forming abnormal blood clots that can lead to stroke. If you have the irregular heart rhythm of atrial fibrillation, you should be on a blood thinner unless there is a good reason you cannot take them.  Understand all your medicine instructions.  Make sure that other conditions (such as anemia or atherosclerosis) are addressed. SEEK IMMEDIATE MEDICAL CARE IF:   You have sudden weakness or numbness of the face, arm, or leg, especially on one side of the body.  Your face or eyelid droops to one side.  You have sudden confusion.  You have trouble speaking (aphasia) or understanding.  You have sudden trouble seeing in one or both eyes.  You have sudden trouble walking.  You have dizziness.  You have a loss of balance or coordination.  You have a sudden, severe headache with no known cause.  You have new chest pain or an irregular heartbeat. Any of these symptoms may represent a serious problem that is an emergency. Do not wait to see if the symptoms will   go away. Get medical help at once. Call your local emergency services (911 in [place] Do not drive yourself to the hospital.   This information is not intended to replace advice given to you by your health care provider. Make sure you discuss any questions you have with your health care provider.   Document Released: 07/30/2004 Document Revised: 07/13/2014 Document Reviewed:  12/23/2012 Elsevier Interactive Patient Education 2016 Elsevier Inc.    Peripheral Vascular Disease Peripheral vascular disease (PVD) is a disease of the blood vessels that are not part of your heart and brain. A simple term for PVD is poor circulation. In most cases, PVD narrows the blood vessels that carry blood from your heart to the rest of your body. This can result in a decreased supply of blood to your arms, legs, and internal organs, like your stomach or kidneys. However, it most often affects a person's lower legs and feet. There are two types of PVD.  Organic PVD. This is the more common type. It is caused by damage to the structure of blood vessels.  Functional PVD. This is caused by conditions that make blood vessels contract and tighten (spasm). Without treatment, PVD tends to get worse over time. PVD can also lead to acute ischemic limb. This is when an arm or limb suddenly has trouble getting enough blood. This is a medical emergency. CAUSES Each type of PVD has many different causes. The most common cause of PVD is buildup of a fatty material (plaque) inside of your arteries (atherosclerosis). Small amounts of plaque can break off from the walls of the blood vessels and become lodged in a smaller artery. This blocks blood flow and can cause acute ischemic limb. Other common causes of PVD include:  Blood clots that form inside of blood vessels.  Injuries to blood vessels.  Diseases that cause inflammation of blood vessels or cause blood vessel spasms.  Health behaviors and health history that increase your risk of developing PVD. RISK FACTORS  You may have a greater risk of PVD if you:  Have a family history of PVD.  Have certain medical conditions, including:  High cholesterol.  Diabetes.  High blood pressure (hypertension).  Coronary heart disease.  Past problems with blood clots.  Past injury, such as burns or a broken bone. These may have damaged blood  vessels in your limbs.  Buerger disease. This is caused by inflamed blood vessels in your hands and feet.  Some forms of arthritis.  Rare birth defects that affect the arteries in your legs.  Use tobacco.  Do not get enough exercise.  Are obese.  Are age 50 or older. SIGNS AND SYMPTOMS  PVD may cause many different symptoms. Your symptoms depend on what part of your body is not getting enough blood. Some common signs and symptoms include:  Cramps in your lower legs. This may be a symptom of poor leg circulation (claudication).  Pain and weakness in your legs while you are physically active that goes away when you rest (intermittent claudication).  Leg pain when at rest.  Leg numbness, tingling, or weakness.  Coldness in a leg or foot, especially when compared with the other leg.  Skin or hair changes. These can include:  Hair loss.  Shiny skin.  Pale or bluish skin.  Thick toenails.  Inability to get or maintain an erection (erectile dysfunction). People with PVD are more prone to developing ulcers and sores on their toes, feet, or legs. These may take longer than   normal to heal. DIAGNOSIS Your health care provider may diagnose PVD from your signs and symptoms. The health care provider will also do a physical exam. You may have tests to find out what is causing your PVD and determine its severity. Tests may include:  Blood pressure recordings from your arms and legs and measurements of the strength of your pulses (pulse volume recordings).  Imaging studies using sound waves to take pictures of the blood flow through your blood vessels (Doppler ultrasound).  Injecting a dye into your blood vessels before having imaging studies using:  X-rays (angiogram or arteriogram).  Computer-generated X-rays (CT angiogram).  A powerful electromagnetic field and a computer (magnetic resonance angiogram or MRA). TREATMENT Treatment for PVD depends on the cause of your condition  and the severity of your symptoms. It also depends on your age. Underlying causes need to be treated and controlled. These include long-lasting (chronic) conditions, such as diabetes, high cholesterol, and high blood pressure. You may need to first try making lifestyle changes and taking medicines. Surgery may be needed if these do not work. Lifestyle changes may include:  Quitting smoking.  Exercising regularly.  Following a low-fat, low-cholesterol diet. Medicines may include:  Blood thinners to prevent blood clots.  Medicines to improve blood flow.  Medicines to improve your blood cholesterol levels. Surgical procedures may include:  A procedure that uses an inflated balloon to open a blocked artery and improve blood flow (angioplasty).  A procedure to put in a tube (stent) to keep a blocked artery open (stent implant).  Surgery to reroute blood flow around a blocked artery (peripheral bypass surgery).  Surgery to remove dead tissue from an infected wound on the affected limb.  Amputation. This is surgical removal of the affected limb. This may be necessary in cases of acute ischemic limb that are not improved through medical or surgical treatments. HOME CARE INSTRUCTIONS  Take medicines only as directed by your health care provider.  Do not use any tobacco products, including cigarettes, chewing tobacco, or electronic cigarettes. If you need help quitting, ask your health care provider.  Lose weight if you are overweight, and maintain a healthy weight as directed by your health care provider.  Eat a diet that is low in fat and cholesterol. If you need help, ask your health care provider.  Exercise regularly. Ask your health care provider to suggest some good activities for you.  Use compression stockings or other mechanical devices as directed by your health care provider.  Take good care of your feet.  Wear comfortable shoes that fit well.  Check your feet often for  any cuts or sores. SEEK MEDICAL CARE IF:  You have cramps in your legs while walking.  You have leg pain when you are at rest.  You have coldness in a leg or foot.  Your skin changes.  You have erectile dysfunction.  You have cuts or sores on your feet that are not healing. SEEK IMMEDIATE MEDICAL CARE IF:  Your arm or leg turns cold and blue.  Your arms or legs become red, warm, swollen, painful, or numb.  You have chest pain or trouble breathing.  You suddenly have weakness in your face, arm, or leg.  You become very confused or lose the ability to speak.  You suddenly have a very bad headache or lose your vision.   This information is not intended to replace advice given to you by your health care provider. Make sure you discuss any questions   you have with your health care provider.   Document Released: 07/30/2004 Document Revised: 07/13/2014 Document Reviewed: 11/30/2013 Elsevier Interactive Patient Education 2016 Elsevier Inc.  

## 2015-09-25 NOTE — Progress Notes (Signed)
[facility]  HISTORY AND PHYSICAL   MRN : [id number]  History of Present Illness:   [name] [name] is a 65 y.o. male patient of Dr. 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 Carotid duplex suggested a moderate carotid stenosis on the left and a tight right carotid stenosis. CT angiogram suggested a string sign on the right with no significant stenosis on the left. This reason, given the discrepancy, he underwent a cerebral arteriogram on 03/19/2014. This showed no significant disease of the aortic arch. On the right side, which was the asymptomatic side, there was a 70% smooth carotid stenosis. Of note the patient has had previous surgery on the right side of the neck and also radiation therapy. On the left side, which was the symptomatic side there was no significant stenosis. There was slight irregularity but minimal plaque. Pt denies any subsequent stroke or TIA.  Pt brought a ferritin result with him that was 651.9 (normal range is 25-350) on 09/12/15, requested by his [place] PCP.   Dr. Allean Found at performed a prosthetic PTFE bypass on the right leg and a subsequent redo bypass with vein and tibial thrombectomy for an ischemic right lower extremity in 2004.  He has left calf claudication after walking about 5 minutes, relieved by a few minutes of rest, denies non healing wounds.  Last ABI result on file was 10/31/09, right was 0.92, left was 0.40.  He is on Plavix. He does not take aspirin because he is on Plavix. He is on a statin.   Dr. Scot Dock last saw pt on 09/19/14. At that time Dr. Scot Dock indicated in his assessment that we would not consider right carotid endarterectomy unless the stenosis progressed to greater than 80%. If he did have significant progression of the disease on the right he would have to be considered for carotid stenting given his previous surgery in the right neck and previous radiation therapy. On the left side  he has a 40-59% stenosis. Pt returns today for 6 months follow up surveillance of his carotid arteries and peripheral artery disease.  Patient has not had previous carotid artery intervention.  Pt states he was told by his PCP at the [facility] that he should not take NSAID's as he has a problem with vessels leading to his kidneys as found on an ultrasound. Pt's neurologist is Dr. Erlinda Hong.  The patient denies New Medical or Surgical History.  Pt Diabetic: no Pt smoker: former smoker, quit in 2002  Pt meds include: Statin : yes ASA: no Other anticoagulants/antiplatelets: Plavix     Current Outpatient Prescriptions  Medication Sig Dispense Refill  . atorvastatin (LIPITOR) 20 MG tablet Take 20 mg by mouth daily.    . clopidogrel (PLAVIX) 75 MG tablet Take 1 tablet (75 mg total) by mouth daily. 30 tablet 1   No current facility-administered medications for this visit.    Past Medical History  Diagnosis Date  . Hyperlipemia   . Cancer (HCC)     throat, lymph node  . GERD (gastroesophageal reflux disease)   . PVD (peripheral vascular disease) ([place])   . Carotid artery occlusion   . Stroke [facility])     Social History Social History  Substance Use Topics  . Smoking status: Former Smoker    Quit date: 03/14/2001  . Smokeless tobacco: Never Used  . Alcohol Use: No    Family History Family History  Problem Relation Age of Onset  .  Lung cancer Mother   . Cancer Mother   . CAD Father   . Heart disease Father     Surgical History Past Surgical History  Procedure Laterality Date  . Cholecystectomy    . Lymphadenectomy Right 2009    neck  . Pr vein bypass graft,aorto-fem-pop    . Cerebral angiogram N/A 03/19/2014    Procedure: CEREBRAL ANGIOGRAM;  Surgeon: Angelia Mould, MD;  Location: [facility] CATH LAB;  Service: Cardiovascular;  Laterality: N/A;  . Arch aortogram N/A 03/19/2014    Procedure: ARCH AORTOGRAM;  Surgeon: Angelia Mould, MD;  Location: [facility] CATH  LAB;  Service: Cardiovascular;  Laterality: N/A;    No Known Allergies  Current Outpatient Prescriptions  Medication Sig Dispense Refill  . atorvastatin (LIPITOR) 20 MG tablet Take 20 mg by mouth daily.    . clopidogrel (PLAVIX) 75 MG tablet Take 1 tablet (75 mg total) by mouth daily. 30 tablet 1   No current facility-administered medications for this visit.     REVIEW OF SYSTEMS: See HPI for pertinent positives and negatives.  Physical Examination Filed Vitals:   09/25/15 1543  BP: 126/72  Pulse: 70  Temp: 97.9 F (36.6 C)  Resp: 14  Height: 5\' 4"  (1.626 m)  Weight: 159 lb (72.122 kg)  SpO2: 98%   Body mass index is 27.28 kg/(m^2).  General: WDWN male in NAD Neck: Sclerosed tissue at right side of neck s/p lymphadenectomy in 2009 and radiation tx for tonsillar cancer. GAIT: slightly stilted Eyes: PERRLA Pulmonary: Non-labored, CTAB, no rales, no rhonchi, & no wheezing.  Cardiac: regular rhythm, no detected murmur.  VASCULAR EXAM Carotid Bruits Right Left   Negative Negative   Aorta is not palpable. Radial pulses are 2+ palpable and equal.      LE Pulses Right Left   FEMORAL 3+ palpable 1+ palpable    POPLITEAL not palpable  not palpable   POSTERIOR TIBIAL not palpable  not palpable    DORSALIS PEDIS  ANTERIOR TIBIAL 1+ palpable  not palpable     Gastrointestinal: soft, nontender, BS WNL, no r/g, no palpable masses.  Musculoskeletal: No muscle atrophy/wasting. M/S 5/5 throughout except 4/5 in left LE, extremities without ischemic changes.  Neurologic: A&O X 3; Appropriate Affect, Speech is mostly fluent, slightly slurred, CN 2-12 intact, pain and light touch intact in extremities, Motor exam as listed above.          Non-Invasive Vascular Imaging  (09/23/15):  CEREBROVASCULAR DUPLEX EVALUATION    INDICATION: Carotid artery disease     PREVIOUS INTERVENTION(S):     DUPLEX EXAM:     RIGHT  LEFT  Peak Systolic Velocities (cm/s) End Diastolic Velocities (cm/s) Plaque LOCATION Peak Systolic Velocities (cm/s) End Diastolic Velocities (cm/s) Plaque  79 17  CCA PROXIMAL 66 16   62 16  CCA MID 46 12 HT  68 15  CCA DISTAL 50 16 HT  78 14  ECA 116 14 HT  231 85 HT ICA PROXIMAL 80 27 HT  189 76 HT ICA MID 101 37 HT  155 35  ICA DISTAL 92 29      ICA / CCA Ratio (PSV)   Antegrade  Vertebral Flow Antegrade    Brachial Systolic Pressure (mmHg)   T Brachial Artery Waveforms T    Plaque Morphology:  HM = Homogeneous, HT = Heterogeneous, CP = Calcific Plaque, SP = Smooth Plaque, IP = Irregular Plaque     ADDITIONAL FINDINGS:     IMPRESSION: Right  internal carotid artery velocities suggest a 60-79% stenosis. Left internal carotid artery velocities suggest a 40-59% stenosis.     Compared to the previous exam:  No significant changes compared to previous exam.    LOWER EXTREMITY ARTERIAL DUPLEX EVALUATION    INDICATION: Peripheral vascular disease    PREVIOUS INTERVENTION(S): Right femoral-popliteal bypass graft.    DUPLEX EXAM:     RIGHT  LEFT   Peak Systolic Velocity (cm/s) Ratio (if abnormal) Waveform  Peak Systolic Velocity (cm/s) Ratio (if abnormal) Waveform     Common Femoral Artery 58       Deep Femoral Artery 79       Superficial Femoral Artery Proximal 0       Superficial Femoral Artery Mid 0       Superficial Femoral Artery Distal 0       Popliteal Artery r-34  M     Posterior Tibial Artery Dist NV       Anterior Tibial Artery Distal 78  B     Peroneal Artery Distal 16  M  .78 Today's ABI / TBI .50   Previous ABI / TBI ()     Waveform:    M - Monophasic       B - Biphasic       T - Triphasic  If Ankle Brachial Index (ABI) or Toe Brachial Index (TBI) performed, please see complete report     ADDITIONAL  FINDINGS: .    IMPRESSION: Patent right femoral to popliteal bypass graft (see accompanying worksheet for velocities. Left occluded superficial femoral artery with reconstitution in the popliteal. No flow is noted in the bilaterally in the distal posterior tibial arteries.        ASSESSMENT:  [name] [name] is a 65 y.o. male who had a left brain stroke in July 2015 as manifested by slurred speech. He has not had any subsequent neurological event. Dr. Scot Dock indicated in his 09/19/14 assessment that we would not consider right carotid endarterectomy unless the stenosis progressed to greater than 80%. If he did have significant progression of the disease on the right he would have to be considered for carotid stenting given his previous surgery in the right neck and previous radiatio therapy.  09/23/15 carotid duplex suggests  60-79% right ICA stenosis. Left internal carotid artery velocities suggest a 40-59% stenosis. No significant changes compared to previous exam.   His left calf claudication remains stable, with left pain calf after walking 5 minutes, relieved by rest. He has no signs of ischemia in his feet/legs.  09/23/15 bilateral LE arterial duplex suggests a patent right femoral to popliteal bypass graft. Left occluded superficial femoral artery with reconstitution in the popliteal. No flow is noted in the bilaterally in the distal posterior tibial arteries.  ABI's: 78% perfusion to right leg with biphasic waveforms and 50% perfusion to left leg; decrease in right and slight improvement in left compared to last ABI on paper records: 10/31/09, right was 0.92, left was 0.40.   Fortunately he does not have DM and quit smoking in 2002. He takes Plavix and a statin on a regular basis.   Face to face time with patient was 25 minutes. Over 50% of this time was spent on counseling and coordination of care.    PLAN:   Graduated walking program discussed in detail. Based on today's exam  and non-invasive vascular lab results, and after discussing with Dr. Scot Dock, the patient will follow up in 6 months with the following  tests: carotid duplex and ABI's.  I discussed in depth with the patient the nature of atherosclerosis, and emphasized the importance of maximal medical management including strict control of blood pressure, blood glucose, and lipid levels, obtaining regular exercise, and continued cessation of smoking.  The patient is aware that without maximal medical management the underlying atherosclerotic disease process will progress, limiting the benefit of any interventions.  The patient was given information about stroke prevention and what symptoms should prompt the patient to seek immediate medical care.  The patient was given information about PAD including signs, symptoms, treatment, what symptoms should prompt the patient to seek immediate medical care, and risk reduction measures to take. Thank you for allowing [place] to participate in this patient's care.  Clemon Chambers, RN, MSN, FNP-C [facility] Office: [phone]  Clinic MD: Scot Dock  09/25/2015 3:44 PM

## 2015-09-26 ENCOUNTER — Other Ambulatory Visit: Payer: Self-pay | Admitting: *Deleted

## 2015-09-26 DIAGNOSIS — I739 Peripheral vascular disease, unspecified: Secondary | ICD-10-CM

## 2015-09-26 DIAGNOSIS — I6523 Occlusion and stenosis of bilateral carotid arteries: Secondary | ICD-10-CM

## 2016-03-12 DIAGNOSIS — Z23 Encounter for immunization: Secondary | ICD-10-CM | POA: Diagnosis not present

## 2016-03-27 ENCOUNTER — Encounter: Payer: Self-pay | Admitting: Family

## 2016-04-01 ENCOUNTER — Ambulatory Visit (HOSPITAL_COMMUNITY)
Admission: RE | Admit: 2016-04-01 | Discharge: 2016-04-01 | Disposition: A | Discharge status: Home / Self Care | Payer: Medicare Other | Source: Ambulatory Visit | Attending: Family | Admitting: Family

## 2016-04-01 ENCOUNTER — Encounter: Payer: Self-pay | Admitting: Family

## 2016-04-01 ENCOUNTER — Ambulatory Visit (INDEPENDENT_AMBULATORY_CARE_PROVIDER_SITE_OTHER): Payer: Medicare Other | Admitting: Family

## 2016-04-01 VITALS — BP 123/73 | HR 73 | Temp 97.2°F | Resp 16 | Ht 64.0 in | Wt 157.0 lb

## 2016-04-01 DIAGNOSIS — I779 Disorder of arteries and arterioles, unspecified: Secondary | ICD-10-CM | POA: Diagnosis not present

## 2016-04-01 DIAGNOSIS — I739 Peripheral vascular disease, unspecified: Secondary | ICD-10-CM | POA: Insufficient documentation

## 2016-04-01 DIAGNOSIS — R9439 Abnormal result of other cardiovascular function study: Secondary | ICD-10-CM | POA: Diagnosis not present

## 2016-04-01 DIAGNOSIS — I6523 Occlusion and stenosis of bilateral carotid arteries: Secondary | ICD-10-CM | POA: Diagnosis not present

## 2016-04-01 DIAGNOSIS — Z95828 Presence of other vascular implants and grafts: Secondary | ICD-10-CM

## 2016-04-01 DIAGNOSIS — Z923 Personal history of irradiation: Secondary | ICD-10-CM

## 2016-04-01 DIAGNOSIS — Z87891 Personal history of nicotine dependence: Secondary | ICD-10-CM

## 2016-04-01 LAB — VAS US CAROTID
LCCADDIAS: 21 cm/s
LCCADSYS: 70 cm/s
LCCAPDIAS: 20 cm/s
LEFT ECA DIAS: -25 cm/s
LEFT VERTEBRAL DIAS: 13 cm/s
LICADSYS: -107 cm/s
Left CCA prox sys: 101 cm/s
Left ICA dist dias: -34 cm/s
Left ICA prox dias: -28 cm/s
Left ICA prox sys: -92 cm/s
RCCADSYS: -106 cm/s
RCCAPDIAS: 26 cm/s
RIGHT CCA MID DIAS: 22 cm/s
RIGHT ECA DIAS: -20 cm/s
Right CCA prox sys: 100 cm/s

## 2016-04-01 NOTE — Patient Instructions (Addendum)
Stroke Prevention Some medical conditions and behaviors are associated with an increased chance of having a stroke. You may prevent a stroke by making healthy choices and managing medical conditions. HOW CAN I REDUCE MY RISK OF HAVING A STROKE?   Stay physically active. Get at least 30 minutes of activity on most or all days.  Do not smoke. It may also be helpful to avoid exposure to secondhand smoke.  Limit alcohol use. Moderate alcohol use is considered to be:  No more than 2 drinks per day for men.  No more than 1 drink per day for nonpregnant women.  Eat healthy foods. This involves:  Eating 5 or more servings of fruits and vegetables a day.  Making dietary changes that address high blood pressure (hypertension), high cholesterol, diabetes, or obesity.  Manage your cholesterol levels.  Making food choices that are high in fiber and low in saturated fat, trans fat, and cholesterol may control cholesterol levels.  Take any prescribed medicines to control cholesterol as directed by your health care provider.  Manage your diabetes.  Controlling your carbohydrate and sugar intake is recommended to manage diabetes.  Take any prescribed medicines to control diabetes as directed by your health care provider.  Control your hypertension.  Making food choices that are low in salt (sodium), saturated fat, trans fat, and cholesterol is recommended to manage hypertension.  Ask your health care provider if you need treatment to lower your blood pressure. Take any prescribed medicines to control hypertension as directed by your health care provider.  If you are 18-39 years of age, have your blood pressure checked every 3-5 years. If you are 40 years of age or older, have your blood pressure checked every year.  Maintain a healthy weight.  Reducing calorie intake and making food choices that are low in sodium, saturated fat, trans fat, and cholesterol are recommended to manage  weight.  Stop drug abuse.  Avoid taking birth control pills.  Talk to your health care provider about the risks of taking birth control pills if you are over 35 years old, smoke, get migraines, or have ever had a blood clot.  Get evaluated for sleep disorders (sleep apnea).  Talk to your health care provider about getting a sleep evaluation if you snore a lot or have excessive sleepiness.  Take medicines only as directed by your health care provider.  For some people, aspirin or blood thinners (anticoagulants) are helpful in reducing the risk of forming abnormal blood clots that can lead to stroke. If you have the irregular heart rhythm of atrial fibrillation, you should be on a blood thinner unless there is a good reason you cannot take them.  Understand all your medicine instructions.  Make sure that other conditions (such as anemia or atherosclerosis) are addressed. SEEK IMMEDIATE MEDICAL CARE IF:   You have sudden weakness or numbness of the face, arm, or leg, especially on one side of the body.  Your face or eyelid droops to one side.  You have sudden confusion.  You have trouble speaking (aphasia) or understanding.  You have sudden trouble seeing in one or both eyes.  You have sudden trouble walking.  You have dizziness.  You have a loss of balance or coordination.  You have a sudden, severe headache with no known cause.  You have new chest pain or an irregular heartbeat. Any of these symptoms may represent a serious problem that is an emergency. Do not wait to see if the symptoms will   go away. Get medical help at once. Call your local emergency services (911 in [place] Do not drive yourself to the hospital.   This information is not intended to replace advice given to you by your health care provider. Make sure you discuss any questions you have with your health care provider.   Document Released: 07/30/2004 Document Revised: 07/13/2014 Document Reviewed:  12/23/2012 Elsevier Interactive Patient Education 2016 Elsevier Inc.    Peripheral Vascular Disease Peripheral vascular disease (PVD) is a disease of the blood vessels that are not part of your heart and brain. A simple term for PVD is poor circulation. In most cases, PVD narrows the blood vessels that carry blood from your heart to the rest of your body. This can result in a decreased supply of blood to your arms, legs, and internal organs, like your stomach or kidneys. However, it most often affects a person's lower legs and feet. There are two types of PVD.  Organic PVD. This is the more common type. It is caused by damage to the structure of blood vessels.  Functional PVD. This is caused by conditions that make blood vessels contract and tighten (spasm). Without treatment, PVD tends to get worse over time. PVD can also lead to acute ischemic limb. This is when an arm or limb suddenly has trouble getting enough blood. This is a medical emergency. CAUSES Each type of PVD has many different causes. The most common cause of PVD is buildup of a fatty material (plaque) inside of your arteries (atherosclerosis). Small amounts of plaque can break off from the walls of the blood vessels and become lodged in a smaller artery. This blocks blood flow and can cause acute ischemic limb. Other common causes of PVD include:  Blood clots that form inside of blood vessels.  Injuries to blood vessels.  Diseases that cause inflammation of blood vessels or cause blood vessel spasms.  Health behaviors and health history that increase your risk of developing PVD. RISK FACTORS  You may have a greater risk of PVD if you:  Have a family history of PVD.  Have certain medical conditions, including:  High cholesterol.  Diabetes.  High blood pressure (hypertension).  Coronary heart disease.  Past problems with blood clots.  Past injury, such as burns or a broken bone. These may have damaged blood  vessels in your limbs.  Buerger disease. This is caused by inflamed blood vessels in your hands and feet.  Some forms of arthritis.  Rare birth defects that affect the arteries in your legs.  Use tobacco.  Do not get enough exercise.  Are obese.  Are age 50 or older. SIGNS AND SYMPTOMS  PVD may cause many different symptoms. Your symptoms depend on what part of your body is not getting enough blood. Some common signs and symptoms include:  Cramps in your lower legs. This may be a symptom of poor leg circulation (claudication).  Pain and weakness in your legs while you are physically active that goes away when you rest (intermittent claudication).  Leg pain when at rest.  Leg numbness, tingling, or weakness.  Coldness in a leg or foot, especially when compared with the other leg.  Skin or hair changes. These can include:  Hair loss.  Shiny skin.  Pale or bluish skin.  Thick toenails.  Inability to get or maintain an erection (erectile dysfunction). People with PVD are more prone to developing ulcers and sores on their toes, feet, or legs. These may take longer than   normal to heal. DIAGNOSIS Your health care provider may diagnose PVD from your signs and symptoms. The health care provider will also do a physical exam. You may have tests to find out what is causing your PVD and determine its severity. Tests may include:  Blood pressure recordings from your arms and legs and measurements of the strength of your pulses (pulse volume recordings).  Imaging studies using sound waves to take pictures of the blood flow through your blood vessels (Doppler ultrasound).  Injecting a dye into your blood vessels before having imaging studies using:  X-rays (angiogram or arteriogram).  Computer-generated X-rays (CT angiogram).  A powerful electromagnetic field and a computer (magnetic resonance angiogram or MRA). TREATMENT Treatment for PVD depends on the cause of your condition  and the severity of your symptoms. It also depends on your age. Underlying causes need to be treated and controlled. These include long-lasting (chronic) conditions, such as diabetes, high cholesterol, and high blood pressure. You may need to first try making lifestyle changes and taking medicines. Surgery may be needed if these do not work. Lifestyle changes may include:  Quitting smoking.  Exercising regularly.  Following a low-fat, low-cholesterol diet. Medicines may include:  Blood thinners to prevent blood clots.  Medicines to improve blood flow.  Medicines to improve your blood cholesterol levels. Surgical procedures may include:  A procedure that uses an inflated balloon to open a blocked artery and improve blood flow (angioplasty).  A procedure to put in a tube (stent) to keep a blocked artery open (stent implant).  Surgery to reroute blood flow around a blocked artery (peripheral bypass surgery).  Surgery to remove dead tissue from an infected wound on the affected limb.  Amputation. This is surgical removal of the affected limb. This may be necessary in cases of acute ischemic limb that are not improved through medical or surgical treatments. HOME CARE INSTRUCTIONS  Take medicines only as directed by your health care provider.  Do not use any tobacco products, including cigarettes, chewing tobacco, or electronic cigarettes. If you need help quitting, ask your health care provider.  Lose weight if you are overweight, and maintain a healthy weight as directed by your health care provider.  Eat a diet that is low in fat and cholesterol. If you need help, ask your health care provider.  Exercise regularly. Ask your health care provider to suggest some good activities for you.  Use compression stockings or other mechanical devices as directed by your health care provider.  Take good care of your feet.  Wear comfortable shoes that fit well.  Check your feet often for  any cuts or sores. SEEK MEDICAL CARE IF:  You have cramps in your legs while walking.  You have leg pain when you are at rest.  You have coldness in a leg or foot.  Your skin changes.  You have erectile dysfunction.  You have cuts or sores on your feet that are not healing. SEEK IMMEDIATE MEDICAL CARE IF:  Your arm or leg turns cold and blue.  Your arms or legs become red, warm, swollen, painful, or numb.  You have chest pain or trouble breathing.  You suddenly have weakness in your face, arm, or leg.  You become very confused or lose the ability to speak.  You suddenly have a very bad headache or lose your vision.   This information is not intended to replace advice given to you by your health care provider. Make sure you discuss any questions   you have with your health care provider.   Document Released: 07/30/2004 Document Revised: 07/13/2014 Document Reviewed: 11/30/2013 Elsevier Interactive Patient Education 2016 Elsevier Inc.  

## 2016-04-01 NOTE — Progress Notes (Signed)
[facility] [place] HISTORY AND PHYSICAL   MRN : [id number]  History of Present Illness:   [name] [name] is a 65 y.o. male patient of Dr. 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 Carotid duplex suggested a moderate carotid stenosis on the left and a tight right carotid stenosis. CT angiogram suggested a string sign on the right with no significant stenosis on the left. This reason, given the discrepancy, he underwent a cerebral arteriogram on 03/19/2014. This showed no significant disease of the aortic arch. On the right side, which was the asymptomatic side, there was a 70% smooth carotid stenosis. Of note the patient has had previous surgery on the right side of the neck and also radiation therapy. On the left side, which was the symptomatic side there was no significant stenosis. There was slight irregularity but minimal plaque. Pt denies any subsequent stroke or TIA.   Dr. Allean Found performed a prosthetic PTFE bypass on the right leg and a subsequent redo bypass with vein and tibial thrombectomy for an ischemic right lower extremity in 2004.  He has left calf claudication after walking about 5+ minutes, relieved by a few minutes of rest, denies non healing wounds.  ABI result on 10/31/09: right was 0.92, left was 0.40.  He is on Plavix. He does not take aspirin because he is on Plavix. He is on a statin.   Dr. Scot Dock saw pt on 09/19/14. At that time Dr. Scot Dock indicated in his assessment that we would not consider right carotid endarterectomy unless the stenosis progressed to greater than 80%. If he did have significant progression of the disease on the right he would have to be considered for carotid stenting given his previous surgery in the right neck and previous radiation therapy. On the left side he had a 40-59% stenosis.  Pt returns today for 6 months follow up surveillance of his carotid arteries and peripheral artery  disease.  Patient has not had previous carotid artery intervention.  Pt states he was told by his PCP at the [facility] that he should not take NSAID's as he has a problem with vessels leading to his kidneys as found on an ultrasound. Pt's neurologist is Dr. Erlinda Hong.  The patient denies New Medical or Surgical History.  Pt Diabetic: no Pt smoker: former smoker, quit in 2002  Pt meds include: Statin : yes ASA: no Other anticoagulants/antiplatelets: Plavix     Current Outpatient Prescriptions  Medication Sig Dispense Refill  . atorvastatin (LIPITOR) 20 MG tablet Take 20 mg by mouth daily.    . clopidogrel (PLAVIX) 75 MG tablet Take 1 tablet (75 mg total) by mouth daily. 30 tablet 1  . FLUZONE HIGH-DOSE 0.5 ML SUSY inject 0.5 milliliter intramuscularly  0   No current facility-administered medications for this visit.     Past Medical History:  Diagnosis Date  . Cancer (HCC)    throat, lymph node  . Carotid artery occlusion   . GERD (gastroesophageal reflux disease)   . Hyperlipemia   . PVD (peripheral vascular disease) ([place])   . Stroke [facility])     Social History Social History  Substance Use Topics  . Smoking status: Former Smoker    Quit date: 03/14/2001  . Smokeless tobacco: Never Used  . Alcohol use No    Family History Family History  Problem Relation Age of Onset  . Lung cancer Mother   . Cancer Mother   .  CAD Father   . Heart disease Father     Surgical History Past Surgical History:  Procedure Laterality Date  . ARCH AORTOGRAM N/A 03/19/2014   Procedure: ARCH AORTOGRAM;  Surgeon: Angelia Mould, MD;  Location: [facility] CATH LAB;  Service: Cardiovascular;  Laterality: N/A;  . CEREBRAL ANGIOGRAM N/A 03/19/2014   Procedure: CEREBRAL ANGIOGRAM;  Surgeon: Angelia Mould, MD;  Location: [facility] CATH LAB;  Service: Cardiovascular;  Laterality: N/A;  . CHOLECYSTECTOMY    . LYMPHADENECTOMY Right 2009   neck  . PR VEIN BYPASS GRAFT,AORTO-FEM-POP       No Known Allergies  Current Outpatient Prescriptions  Medication Sig Dispense Refill  . atorvastatin (LIPITOR) 20 MG tablet Take 20 mg by mouth daily.    . clopidogrel (PLAVIX) 75 MG tablet Take 1 tablet (75 mg total) by mouth daily. 30 tablet 1  . FLUZONE HIGH-DOSE 0.5 ML SUSY inject 0.5 milliliter intramuscularly  0   No current facility-administered medications for this visit.      REVIEW OF SYSTEMS: See HPI for pertinent positives and negatives.  Physical Examination Vitals:   04/01/16 1448 04/01/16 1450  BP: (!) 121/58 123/73  Pulse: 76 73  Resp: 16   Temp: 97.2 F (36.2 C)   Weight: 157 lb (71.2 kg)   Height: 5\' 4"  (1.626 m)    Body mass index is 26.95 kg/m.  General: WDWN male in NAD Neck: Sclerosed tissue at right side of neck s/p lymphadenectomy in 2009 and radiation tx for tonsillar cancer. GAIT: slightly stilted Eyes: PERRLA Pulmonary: Respirations are non-labored, CTAB, good air movement, no rales, rhonchi, or wheezing.  Cardiac: regular rhythm and rate, no detected murmur.  VASCULAR EXAM Carotid Bruits Right Left   positive Negative   Aorta is not palpable. Radial pulses are 2+ palpable and equal.      LE Pulses Right Left   FEMORAL 3+ palpable 1+ palpable    POPLITEAL not palpable  not palpable   POSTERIOR TIBIAL not palpable  not palpable    DORSALIS PEDIS  ANTERIOR TIBIAL 1+ palpable  not palpable     Gastrointestinal: soft, nontender, BS WNL, no r/g, no palpable masses.  Musculoskeletal: No muscle atrophy/wasting. M/S 5/5 throughout except 4/5 in left LE, extremities without ischemic changes.  Neurologic: A&O X 3; Appropriate Affect, Speech is mostly fluent, slightly slurred, CN 2-12 intact, pain and light touch intact in extremities, Motor exam as  listed above.    ASSESSMENT:  [name] [name] is a 65 y.o. male who had a left brain stroke in July 2015 as manifested by slurred speech. He has not had any subsequent neurological event. Dr. Scot Dock indicated in his 09/19/14 assessment that we would not consider right carotid endarterectomy unless the stenosis progressed to greater than 80%. If he did have significant progression of the disease on the right he would have to be considered for carotid stenting given his previous surgery in the right neck and previous radiatio therapy.  His left calf claudication remains stable, with left pain calf after walking 5 minutes, relieved by rest. He has no signs of ischemia in his feet/legs.  No claudication in the right leg.  Fortunately he does not have DM and quit smoking in 2002. He takes Plavix and a statin on a regular basis.  Pt denies any known cardiac problems.   Dr. Scot Dock spoke with and examined pt.     DATA Today's carotid duplex suggests  80-99% (low end of range) right proximal ICA  stenosis. Left internal carotid artery velocities suggest a 40-59% stenosis.  Increase in right ICA  compared to previous exam on 09/23/15.   ABI/TBI: Right: 0.85/0.49 with monophasic waveforms; Left: 0.42/0.26  42% perfusion to left leg; decrease in right and slight improvement in left compared to last ABI on paper records: 09/23/15 ABI/TBI: Right: 0.78/0.46; Left: 0.50/0.38 10/31/09 ABI, right was 0.92, left was 0.40.   09/23/15 bilateral LE arterial duplex suggested a patent right femoral to popliteal bypass graft. Left occluded superficial femoral artery with reconstitution in the popliteal. No flow was noted in the bilaterally in the distal posterior tibial arteries.    PLAN:   Based on today's exam and non-invasive vascular lab results, the patient will be scheduled for CTA of neck, then follow up with Dr. Trula Slade or Dr. Oneida Alar to discuss possible stenting of his right carotid artery.  I  discussed in depth with the patient the nature of atherosclerosis, and emphasized the importance of maximal medical management including strict control of blood pressure, blood glucose, and lipid levels, obtaining regular exercise, and cessation of smoking.  The patient is aware that without maximal medical management the underlying atherosclerotic disease process will progress, limiting the benefit of any interventions.  The patient was given information about stroke prevention and what symptoms should prompt the patient to seek immediate medical care.  The patient was given information about PAD including signs, symptoms, treatment, what symptoms should prompt the patient to seek immediate medical care, and risk reduction measures to take. Thank you for allowing [place] to participate in this patient's care.  Clemon Chambers, RN, MSN, FNP-C [facility] Office: [phone]  Clinic MD: Scot Dock 04/01/2016 2:54 PM

## 2016-04-03 ENCOUNTER — Other Ambulatory Visit: Payer: Self-pay | Admitting: Family

## 2016-04-03 NOTE — Addendum Note (Signed)
Addended by: Kaleen Mask on: 04/03/2016 09:37 AM   Modules accepted: Orders

## 2016-04-06 ENCOUNTER — Ambulatory Visit
Admission: RE | Admit: 2016-04-06 | Discharge: 2016-04-06 | Disposition: A | Discharge status: Home / Self Care | Payer: Medicare Other | Source: Ambulatory Visit | Attending: Family | Admitting: Family

## 2016-04-06 DIAGNOSIS — I6523 Occlusion and stenosis of bilateral carotid arteries: Secondary | ICD-10-CM

## 2016-04-06 MED ORDER — IOPAMIDOL (ISOVUE-370) INJECTION 76%
75.0000 mL | Freq: Once | INTRAVENOUS | Status: AC | PRN
  Administered 2016-04-06: 75 mL via INTRAVENOUS

## 2016-04-08 ENCOUNTER — Encounter: Payer: Self-pay | Admitting: Vascular Surgery

## 2016-04-09 ENCOUNTER — Encounter: Payer: Self-pay | Admitting: Vascular Surgery

## 2016-04-09 ENCOUNTER — Ambulatory Visit (INDEPENDENT_AMBULATORY_CARE_PROVIDER_SITE_OTHER): Payer: Medicare Other | Admitting: Vascular Surgery

## 2016-04-09 VITALS — BP 118/60 | HR 70 | Temp 96.7°F | Resp 16 | Ht 64.0 in | Wt 156.0 lb

## 2016-04-09 DIAGNOSIS — I779 Disorder of arteries and arterioles, unspecified: Secondary | ICD-10-CM

## 2016-04-09 DIAGNOSIS — I6523 Occlusion and stenosis of bilateral carotid arteries: Secondary | ICD-10-CM

## 2016-04-09 NOTE — Progress Notes (Signed)
Patient name: [name] [name] MRN: [id number] DOB: 11/16/1950 Sex: male  REASON FOR CONSULT: Asymptomatic right internal carotid artery stenosis  HPI: [name] [name] is a 65 y.o. male  patient of Dr. 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 Carotid duplex suggested a moderate carotid stenosis on the left and a tight right carotid stenosis. CT angiogram suggested a string sign on the right with no significant stenosis on the left. This reason, given the discrepancy, he underwent a cerebral arteriogram on 03/19/2014. This showed no significant disease of the aortic arch. On the right side, which was the asymptomatic side, there was a 70% smooth carotid stenosis. Of note the patient has had previous surgery on the right side of the neck and also radiation therapy. On the left side, which was the symptomatic side there was no significant stenosis. There was slight irregularity but minimal plaque. Baseline creatinine 1.4.  Pt denies any subsequent stroke or TIA. He was found on recent duplex scan to have greater than 80% stenosis which was confirmed on CT angiogram. He presents today for consideration for carotid stenting due to high risk from previous neck operation and radiation on the right side.   Dr. Allean Found performed a prosthetic PTFE bypass on the right leg and a subsequent redo bypass with vein and tibial thrombectomy for an ischemic right lower extremity in 2004.   He has left calf claudication after walking about 5+ minutes, relieved by a few minutes of rest, denies non healing wounds.   ABI result on 10/31/09: right was 0.92, left was 0.40.   He is on Plavix. He does not take aspirin because he is on Plavix. He is on a statin.     Past Medical History:  Diagnosis Date  . Cancer (HCC)    throat, lymph node  . Carotid artery occlusion   . GERD (gastroesophageal reflux disease)   . Hyperlipemia   . PVD (peripheral vascular disease) ([place])   . Stroke [facility])     Past Surgical History:  Procedure Laterality Date  . ARCH AORTOGRAM N/A 03/19/2014   Procedure: ARCH AORTOGRAM;  Surgeon: Angelia Mould, MD;  Location: [facility] CATH LAB;  Service: Cardiovascular;  Laterality: N/A;  . CEREBRAL ANGIOGRAM N/A 03/19/2014   Procedure: CEREBRAL ANGIOGRAM;  Surgeon: Angelia Mould, MD;  Location: [facility] CATH LAB;  Service: Cardiovascular;  Laterality: N/A;  . CHOLECYSTECTOMY    . LYMPHADENECTOMY Right 2009   neck  . PR VEIN BYPASS GRAFT,AORTO-FEM-POP      Family History  Problem Relation Age of Onset  . Lung cancer Mother   . Cancer Mother   . CAD Father   . Heart disease Father     SOCIAL HISTORY: Social History   Social History  . Marital status: Married    Spouse name: N/A  . Number of children: 2  . Years of education: 12th   Occupational History  . retired    Social History Main Topics  . Smoking status: Former Smoker    Quit date: 03/14/2001  . Smokeless tobacco: Never Used  . Alcohol use No  . Drug use: No  . Sexual activity: Yes   Other Topics Concern  . Not on file   Social History Narrative   Patient is married with 2 children.   Patient is left handed.   Patient has 12 th grade education.   Patient drinks 5 or more cups daily.  No Known Allergies  Current Outpatient Prescriptions  Medication Sig Dispense Refill  . atorvastatin (LIPITOR) 20 MG tablet Take 20 mg by mouth daily.    . clopidogrel (PLAVIX) 75 MG tablet Take 1 tablet (75 mg total) by mouth daily. 30 tablet 1  . FLUZONE HIGH-DOSE 0.5 ML SUSY inject 0.5 milliliter intramuscularly  0   No current facility-administered medications for this visit.     ROS:   General:  No weight loss, Fever, chills  HEENT: No recent headaches, no nasal bleeding, no visual changes, no sore throat  Neurologic: No dizziness, blackouts, seizures. No recent symptoms of stroke or mini- stroke. No recent episodes of slurred speech, or temporary blindness.  Cardiac: No  recent episodes of chest pain/pressure, no shortness of breath at rest.  No shortness of breath with exertion.  Denies history of atrial fibrillation or irregular heartbeat  Vascular: No history of rest pain in feet.  No history of claudication.  No history of non-healing ulcer, No history of DVT   Pulmonary: No home oxygen, no productive cough, no hemoptysis,  No asthma or wheezing  Musculoskeletal:  [ ]  Arthritis, [ ]  Low back pain,  [ ]  Joint pain  Hematologic:No history of hypercoagulable state.  No history of easy bleeding.  No history of anemia  Gastrointestinal: No hematochezia or melena,  No gastroesophageal reflux, no trouble swallowing  Urinary: [ ]  chronic Kidney disease, [ ]  on HD - [ ]  MWF or [ ]  TTHS, [ ]  Burning with urination, [ ]  Frequent urination, [ ]  Difficulty urinating;   Skin: No rashes  Psychological: No history of anxiety,  No history of depression   Physical Examination  Vitals:   04/09/16 1422 04/09/16 1426  BP: 118/60 118/60  Pulse: 70   Resp: 16   Temp: (!) 96.7 F (35.9 C)   TempSrc: Oral   SpO2: 97%   Weight: 156 lb (70.8 kg)   Height: 5\' 4"  (1.626 m)     Body mass index is 26.78 kg/m.  General:  Alert and oriented, no acute distress HEENT: All tissue defect right neck with thinned out skin and hoarseness Neck: No bruit or JVD Pulmonary: Clear to auscultation bilaterally Cardiac: Regular Rate and Rhythm without murmur Abdomen: Soft, non-tender, non-distended, no mass, no scars Skin: No rash Extremity Pulses:  2+ radial, brachial, femoral pulses bilaterally, all healed right groin scar Musculoskeletal: No deformity or edema  Neurologic: Upper and lower extremity motor 5/5 and symmetric  DATA:  CT images of the neck were reviewed as well as the patient's previous carotid angiogram from 2015 over some tortuosity of the right common carotid. There is a bovine arch. Was a tapered narrowing of the right internal carotid artery with some  calcification along 1 lateral wall but not circumferential.  ASSESSMENT:  Patient will be high risk for open carotid endarterectomy due to prior radiation of the neck as well as his hoarseness.   PLAN:  Right carotid stent tentatively scheduled for 04/17/2016. Plan will be to stick the left groin due to the prior right lower extremity bypass. The patient will start aspirin in addition to his Plavix today. Risks benefits possible complications and procedure details including but not limited to bleeding contrast reaction infection stroke risk of 3-5% were explained the patient today. He understands and agrees to proceed.   Ruta Hinds, MD [facility] [place] Office: [phone] Pager: [phone]

## 2016-04-10 ENCOUNTER — Other Ambulatory Visit: Payer: Self-pay

## 2016-04-17 ENCOUNTER — Ambulatory Visit (HOSPITAL_COMMUNITY)
Admission: RE | Admit: 2016-04-17 | Discharge: 2016-04-17 | Disposition: A | Discharge status: Home / Self Care | Payer: Medicare Other | Source: Ambulatory Visit | Attending: Vascular Surgery | Admitting: Vascular Surgery

## 2016-04-17 ENCOUNTER — Encounter (HOSPITAL_COMMUNITY): Payer: Self-pay | Admitting: Vascular Surgery

## 2016-04-17 ENCOUNTER — Telehealth: Payer: Self-pay | Admitting: Vascular Surgery

## 2016-04-17 ENCOUNTER — Encounter (HOSPITAL_COMMUNITY)
Admission: RE | Disposition: A | Discharge status: Home / Self Care | Payer: Self-pay | Source: Ambulatory Visit | Attending: Vascular Surgery

## 2016-04-17 ENCOUNTER — Other Ambulatory Visit: Payer: Self-pay

## 2016-04-17 DIAGNOSIS — I6521 Occlusion and stenosis of right carotid artery: Secondary | ICD-10-CM | POA: Diagnosis not present

## 2016-04-17 DIAGNOSIS — I739 Peripheral vascular disease, unspecified: Secondary | ICD-10-CM | POA: Diagnosis not present

## 2016-04-17 DIAGNOSIS — Z801 Family history of malignant neoplasm of trachea, bronchus and lung: Secondary | ICD-10-CM | POA: Insufficient documentation

## 2016-04-17 DIAGNOSIS — Z7902 Long term (current) use of antithrombotics/antiplatelets: Secondary | ICD-10-CM | POA: Diagnosis not present

## 2016-04-17 DIAGNOSIS — Z87891 Personal history of nicotine dependence: Secondary | ICD-10-CM | POA: Insufficient documentation

## 2016-04-17 DIAGNOSIS — Z95828 Presence of other vascular implants and grafts: Secondary | ICD-10-CM

## 2016-04-17 DIAGNOSIS — K219 Gastro-esophageal reflux disease without esophagitis: Secondary | ICD-10-CM | POA: Insufficient documentation

## 2016-04-17 DIAGNOSIS — Q248 Other specified congenital malformations of heart: Secondary | ICD-10-CM | POA: Diagnosis not present

## 2016-04-17 DIAGNOSIS — Z8673 Personal history of transient ischemic attack (TIA), and cerebral infarction without residual deficits: Secondary | ICD-10-CM | POA: Insufficient documentation

## 2016-04-17 DIAGNOSIS — Z8249 Family history of ischemic heart disease and other diseases of the circulatory system: Secondary | ICD-10-CM | POA: Diagnosis not present

## 2016-04-17 DIAGNOSIS — Z923 Personal history of irradiation: Secondary | ICD-10-CM | POA: Diagnosis not present

## 2016-04-17 DIAGNOSIS — I6523 Occlusion and stenosis of bilateral carotid arteries: Secondary | ICD-10-CM

## 2016-04-17 DIAGNOSIS — Z8589 Personal history of malignant neoplasm of other organs and systems: Secondary | ICD-10-CM | POA: Insufficient documentation

## 2016-04-17 DIAGNOSIS — E785 Hyperlipidemia, unspecified: Secondary | ICD-10-CM | POA: Diagnosis not present

## 2016-04-17 HISTORY — PX: PERIPHERAL VASCULAR CATHETERIZATION: SHX172C

## 2016-04-17 LAB — POCT I-STAT, CHEM 8
BUN: 15 mg/dL (ref 6–20)
CHLORIDE: 101 mmol/L (ref 101–111)
CREATININE: 1.6 mg/dL — AB (ref 0.61–1.24)
Calcium, Ion: 1.17 mmol/L (ref 1.15–1.40)
Glucose, Bld: 86 mg/dL (ref 65–99)
HEMATOCRIT: 38 % — AB (ref 39.0–52.0)
Hemoglobin: 12.9 g/dL — ABNORMAL LOW (ref 13.0–17.0)
Potassium: 3.9 mmol/L (ref 3.5–5.1)
Sodium: 137 mmol/L (ref 135–145)
TCO2: 23 mmol/L (ref 0–100)

## 2016-04-17 SURGERY — CAROTID PTA/STENT INTERVENTION
Anesthesia: LOCAL | Laterality: Right

## 2016-04-17 MED ORDER — ONDANSETRON HCL 4 MG/2ML IJ SOLN: 4.0000 mg | Freq: Four times a day (QID) | INTRAMUSCULAR | Status: DC | PRN

## 2016-04-17 MED ORDER — SODIUM CHLORIDE 0.45 % IV SOLN
INTRAVENOUS | Status: DC
  Administered 2016-04-17: 09:00:00 via INTRAVENOUS

## 2016-04-17 MED ORDER — LIDOCAINE HCL (PF) 1 % IJ SOLN
INTRAMUSCULAR | Status: AC
  Filled 2016-04-17: qty 30

## 2016-04-17 MED ORDER — LIDOCAINE HCL (PF) 1 % IJ SOLN
INTRAMUSCULAR | Status: DC | PRN
  Administered 2016-04-17: 13 mL via SUBCUTANEOUS

## 2016-04-17 MED ORDER — LABETALOL HCL 5 MG/ML IV SOLN: 10.0000 mg | INTRAVENOUS | Status: DC | PRN

## 2016-04-17 MED ORDER — IODIXANOL 320 MG/ML IV SOLN
INTRAVENOUS | Status: DC | PRN
  Administered 2016-04-17: 72 mL via INTRAVENOUS

## 2016-04-17 MED ORDER — SODIUM CHLORIDE 0.9 % IV SOLN
INTRAVENOUS | Status: DC
  Administered 2016-04-17: 06:00:00 via INTRAVENOUS

## 2016-04-17 MED ORDER — HEPARIN (PORCINE) IN NACL 2-0.9 UNIT/ML-% IJ SOLN
INTRAMUSCULAR | Status: AC
  Filled 2016-04-17: qty 1000

## 2016-04-17 MED ORDER — HYDRALAZINE HCL 20 MG/ML IJ SOLN: 5.0000 mg | INTRAMUSCULAR | Status: DC | PRN

## 2016-04-17 MED ORDER — HEPARIN (PORCINE) IN NACL 2-0.9 UNIT/ML-% IJ SOLN
INTRAMUSCULAR | Status: DC | PRN
  Administered 2016-04-17: 1000 mL via INTRA_ARTERIAL

## 2016-04-17 MED ORDER — ACETAMINOPHEN 325 MG RE SUPP
325.0000 mg | RECTAL | Status: DC | PRN
  Filled 2016-04-17: qty 2

## 2016-04-17 MED ORDER — MORPHINE SULFATE (PF) 10 MG/ML IV SOLN: 2.0000 mg | INTRAVENOUS | Status: DC | PRN

## 2016-04-17 MED ORDER — ACETAMINOPHEN 325 MG PO TABS
325.0000 mg | ORAL_TABLET | ORAL | Status: DC | PRN
  Filled 2016-04-17: qty 2

## 2016-04-17 MED ORDER — METOPROLOL TARTRATE 5 MG/5ML IV SOLN: 2.0000 mg | INTRAVENOUS | Status: DC | PRN

## 2016-04-17 MED ORDER — ALUM & MAG HYDROXIDE-SIMETH 200-200-20 MG/5ML PO SUSP
15.0000 mL | ORAL | Status: DC | PRN
  Filled 2016-04-17: qty 30

## 2016-04-17 SURGICAL SUPPLY — 9 items
CATH ANGIO 5F BER2 100CM (CATHETERS) ×2 IMPLANT
CATH ANGIO 5F BER2 65CM (CATHETERS) ×2 IMPLANT
CATH ANGIO 5F PIGTAIL 100CM (CATHETERS) ×2 IMPLANT
KIT PV (KITS) ×2 IMPLANT
SHEATH PINNACLE 5F 10CM (SHEATH) ×2 IMPLANT
SYR MEDRAD MARK V 150ML (SYRINGE) ×2 IMPLANT
TRANSDUCER W/STOPCOCK (MISCELLANEOUS) ×2 IMPLANT
TRAY PV CATH (CUSTOM PROCEDURE TRAY) ×2 IMPLANT
WIRE HITORQ VERSACORE ST 145CM (WIRE) ×2 IMPLANT

## 2016-04-17 NOTE — Discharge Instructions (Signed)
Cerebral Angiogram, Care After °Refer to this sheet in the next few weeks. These instructions provide you with information on caring for yourself after your procedure. Your health care provider may also give you more specific instructions. Your treatment has been planned according to current medical practices, but problems sometimes occur. Call your health care provider if you have any problems or questions after your procedure. °WHAT TO EXPECT AFTER THE PROCEDURE °After your procedure, it is typical to have the following: °· Bruising at the catheter insertion site that usually fades within 1-2 weeks. °· Blood collecting in the tissue (hematoma) that may be painful to the touch. It should usually decrease in size and tenderness within 1-2 weeks. °· A mild headache. °HOME CARE INSTRUCTIONS °· Take medicines only as directed by your health care provider. °· You may shower 24-48 hours after the procedure or as directed by your health care provider. Remove the bandage (dressing) and gently wash the site with plain soap and water. Pat the area dry with a clean towel. Do not rub the site, because this may cause bleeding. °· Do not take baths, swim, or use a hot tub until your health care provider approves. °· Check your insertion site every day for redness, swelling, or drainage. °· Do not apply powder or lotion to the site. °· Do not lift over 10 lb (4.5 kg) for 5 days after your procedure or as directed by your health care provider. °· Ask your health care provider when it is okay to: °¨ Return to work or school. °¨ Resume usual physical activities or sports. °¨ Resume sexual activity. °· Do not drive home if you are discharged the same day as the procedure. Have someone else drive you. °· You may drive 24 hours after the procedure unless otherwise instructed by your health care provider. °· Do not operate machinery or power tools for 24 hours after the procedure or as directed by your health care provider. °· If your  procedure was done as an outpatient procedure, which means that you went home the same day as your procedure, a responsible adult should be with you for the first 24 hours after you arrive home. °· Keep all follow-up visits as directed by your health care provider. This is important. °SEEK MEDICAL CARE IF: °· You have a fever. °· You have chills. °· You have increased bleeding from the catheter insertion site. Hold pressure on the site. °SEEK IMMEDIATE MEDICAL CARE IF: °· You have vision changes or loss of vision. °· You have numbness or weakness on one side of your body. °· You have difficulty talking, or you have slurred speech or cannot speak (aphasia). °· You feel confused or have difficulty remembering. °· You have unusual pain at the catheter insertion site. °· You have redness, warmth, or swelling at the catheter insertion site. °· You have drainage (other than a small amount of blood on the dressing) from the catheter insertion site. °· The catheter insertion site is bleeding, and the bleeding does not stop after 30 minutes of holding steady pressure on the site. °These symptoms may represent a serious problem that is an emergency. Do not wait to see if the symptoms will go away. Get medical help right away. Call your local emergency services (911 in [place] Do not drive yourself to the hospital. °  °This information is not intended to replace advice given to you by your health care provider. Make sure you discuss any questions you have with your   health care provider. °  °Document Released: 11/06/2013 Document Revised: 04/10/2014 Document Reviewed: 11/06/2013 °Elsevier Interactive Patient Education ©2016 [employer] ° °

## 2016-04-17 NOTE — Telephone Encounter (Signed)
-----   Message from Denman George, RN sent at 04/17/2016  1:02 PM EDT ----- Regarding: needs 6 mo. f/u with carotid duplex, right fem-pop bp graft duplex, and abi's and appt. with Dr. Scot Dock   ----- Message ----- From: Elam Dutch, MD Sent: 04/17/2016   8:40 AM To: Vvs Charge Pool  [place] left groin Arch aortogram 3rd order cath right carotid  Pt had a 55% stenosis unchanged from 3 years ago.  No intervention performed.  Please let Tye [place] know about the duplex correlation  Pt needs follow up carotid duplex and see Dr Scot Dock in 6 mo.  Can also get ABIs and a graft duplex of his right fem pop at that visit  Ruta Hinds

## 2016-04-17 NOTE — Telephone Encounter (Signed)
Spoke to pt, also mailed letters for US's x3 and f/u ov on 10/21/16

## 2016-04-17 NOTE — Interval H&P Note (Signed)
History and Physical Interval Note:  04/17/2016 7:46 AM  [name] [name]  has presented today for surgery, with the diagnosis of right carotid stenosis  The various methods of treatment have been discussed with the patient and family. After consideration of risks, benefits and other options for treatment, the patient has consented to  Procedure(s): Carotid PTA/Stent Intervention (Right) as a surgical intervention .  The patient's history has been reviewed, patient examined, no change in status, stable for surgery.  I have reviewed the patient's chart and labs.  Questions were answered to the patient's satisfaction.     Ruta Hinds

## 2016-04-17 NOTE — H&P (View-Only) (Signed)
Patient name: [name] [name] MRN: [id number] DOB: 09-Feb-1951 Sex: male  REASON FOR CONSULT: Asymptomatic right internal carotid artery stenosis  HPI: [name] [name] is a 65 y.o. male  patient of Dr. 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 Carotid duplex suggested a moderate carotid stenosis on the left and a tight right carotid stenosis. CT angiogram suggested a string sign on the right with no significant stenosis on the left. This reason, given the discrepancy, he underwent a cerebral arteriogram on 03/19/2014. This showed no significant disease of the aortic arch. On the right side, which was the asymptomatic side, there was a 70% smooth carotid stenosis. Of note the patient has had previous surgery on the right side of the neck and also radiation therapy. On the left side, which was the symptomatic side there was no significant stenosis. There was slight irregularity but minimal plaque. Baseline creatinine 1.4.  Pt denies any subsequent stroke or TIA. He was found on recent duplex scan to have greater than 80% stenosis which was confirmed on CT angiogram. He presents today for consideration for carotid stenting due to high risk from previous neck operation and radiation on the right side.   Dr. Allean Found performed a prosthetic PTFE bypass on the right leg and a subsequent redo bypass with vein and tibial thrombectomy for an ischemic right lower extremity in 2004.   He has left calf claudication after walking about 5+ minutes, relieved by a few minutes of rest, denies non healing wounds.   ABI result on 10/31/09: right was 0.92, left was 0.40.   He is on Plavix. He does not take aspirin because he is on Plavix. He is on a statin.     Past Medical History:  Diagnosis Date  . Cancer (HCC)    throat, lymph node  . Carotid artery occlusion   . GERD (gastroesophageal reflux disease)   . Hyperlipemia   . PVD (peripheral vascular disease) ([place])   . Stroke [facility])     Past Surgical History:  Procedure Laterality Date  . ARCH AORTOGRAM N/A 03/19/2014   Procedure: ARCH AORTOGRAM;  Surgeon: Angelia Mould, MD;  Location: [facility] CATH LAB;  Service: Cardiovascular;  Laterality: N/A;  . CEREBRAL ANGIOGRAM N/A 03/19/2014   Procedure: CEREBRAL ANGIOGRAM;  Surgeon: Angelia Mould, MD;  Location: [facility] CATH LAB;  Service: Cardiovascular;  Laterality: N/A;  . CHOLECYSTECTOMY    . LYMPHADENECTOMY Right 2009   neck  . PR VEIN BYPASS GRAFT,AORTO-FEM-POP      Family History  Problem Relation Age of Onset  . Lung cancer Mother   . Cancer Mother   . CAD Father   . Heart disease Father     SOCIAL HISTORY: Social History   Social History  . Marital status: Married    Spouse name: N/A  . Number of children: 2  . Years of education: 12th   Occupational History  . retired    Social History Main Topics  . Smoking status: Former Smoker    Quit date: 03/14/2001  . Smokeless tobacco: Never Used  . Alcohol use No  . Drug use: No  . Sexual activity: Yes   Other Topics Concern  . Not on file   Social History Narrative   Patient is married with 2 children.   Patient is left handed.   Patient has 12 th grade education.   Patient drinks 5 or more cups daily.  No Known Allergies  Current Outpatient Prescriptions  Medication Sig Dispense Refill  . atorvastatin (LIPITOR) 20 MG tablet Take 20 mg by mouth daily.    . clopidogrel (PLAVIX) 75 MG tablet Take 1 tablet (75 mg total) by mouth daily. 30 tablet 1  . FLUZONE HIGH-DOSE 0.5 ML SUSY inject 0.5 milliliter intramuscularly  0   No current facility-administered medications for this visit.     ROS:   General:  No weight loss, Fever, chills  HEENT: No recent headaches, no nasal bleeding, no visual changes, no sore throat  Neurologic: No dizziness, blackouts, seizures. No recent symptoms of stroke or mini- stroke. No recent episodes of slurred speech, or temporary blindness.  Cardiac: No  recent episodes of chest pain/pressure, no shortness of breath at rest.  No shortness of breath with exertion.  Denies history of atrial fibrillation or irregular heartbeat  Vascular: No history of rest pain in feet.  No history of claudication.  No history of non-healing ulcer, No history of DVT   Pulmonary: No home oxygen, no productive cough, no hemoptysis,  No asthma or wheezing  Musculoskeletal:  [ ]  Arthritis, [ ]  Low back pain,  [ ]  Joint pain  Hematologic:No history of hypercoagulable state.  No history of easy bleeding.  No history of anemia  Gastrointestinal: No hematochezia or melena,  No gastroesophageal reflux, no trouble swallowing  Urinary: [ ]  chronic Kidney disease, [ ]  on HD - [ ]  MWF or [ ]  TTHS, [ ]  Burning with urination, [ ]  Frequent urination, [ ]  Difficulty urinating;   Skin: No rashes  Psychological: No history of anxiety,  No history of depression   Physical Examination  Vitals:   04/09/16 1422 04/09/16 1426  BP: 118/60 118/60  Pulse: 70   Resp: 16   Temp: (!) 96.7 F (35.9 C)   TempSrc: Oral   SpO2: 97%   Weight: 156 lb (70.8 kg)   Height: 5\' 4"  (1.626 m)     Body mass index is 26.78 kg/m.  General:  Alert and oriented, no acute distress HEENT: All tissue defect right neck with thinned out skin and hoarseness Neck: No bruit or JVD Pulmonary: Clear to auscultation bilaterally Cardiac: Regular Rate and Rhythm without murmur Abdomen: Soft, non-tender, non-distended, no mass, no scars Skin: No rash Extremity Pulses:  2+ radial, brachial, femoral pulses bilaterally, all healed right groin scar Musculoskeletal: No deformity or edema  Neurologic: Upper and lower extremity motor 5/5 and symmetric  DATA:  CT images of the neck were reviewed as well as the patient's previous carotid angiogram from 2015 over some tortuosity of the right common carotid. There is a bovine arch. Was a tapered narrowing of the right internal carotid artery with some  calcification along 1 lateral wall but not circumferential.  ASSESSMENT:  Patient will be high risk for open carotid endarterectomy due to prior radiation of the neck as well as his hoarseness.   PLAN:  Right carotid stent tentatively scheduled for 04/17/2016. Plan will be to stick the left groin due to the prior right lower extremity bypass. The patient will start aspirin in addition to his Plavix today. Risks benefits possible complications and procedure details including but not limited to bleeding contrast reaction infection stroke risk of 3-5% were explained the patient today. He understands and agrees to proceed.   Ruta Hinds, MD [facility] [place] Office: [phone] Pager: [phone]

## 2016-04-17 NOTE — Op Note (Addendum)
Procedure: Arch aortogram with selective right carotid Angio  Preoperative diagnosis: Greater than 80% right internal carotid artery stenosis  Postoperative diagnosis: 55% right internal carotid artery stenosis  Anesthesia: Local  Operative findings: #1 bovine aortic arch #2 55% right internal carotid artery stenosis with moderate calcification  Operative details: After obtaining informed consent, the patient was taken to the [facility] lab. The patient was placed in supine position the Angio table. Next both groins were prepped and draped in usual sterile fashion. Local anesthesia was infiltrated over the left common femoral artery. Ultrasound was used to identify the left common femoral artery. An introducer needle was used to cannulate the left common femoral artery without difficulty. An 035 versacore wire was then threaded up into the abdomen and a 5 French sheath placed over this. The 5 French sheath was thoroughly flushed with heparin saline. Guidewire was inspected under fluoroscopy and found to be out a side branch of the external iliac artery. With some manipulation and redirection of the wire with a Berenstein 2 catheter was able to redirect this into the left external iliac artery and advanced this up in the abdominal aorta. A 5 French pig catheter was then placed over the guidewire and these were advanced disease unit of in the ascending aorta and arch aortogram was obtained in a 60 LAO projection. This shows a patent innominate left common left subclavian right subclavian right common carotid arteries with no flow limiting stenosis. There is a bovine arch configuration. At this point the pigtail catheter was pulled back over a guidewire and a 5 [place] Berenstein 2 catheter advanced over the guidewire to selectively catheterize first the right innominate followed by the right common carotid artery. Selective right common carotid artery injection was then performed. This was done in AP and lateral  projection with intracranial views also done AP and lateral projection for interpretation by the neuroradiologist. There is a moderately calcified stenosis which is smooth and tapered in the right internal carotid artery. Measurement of this using NASCET criteria is 55%.  At this point the [facility] 2 catheter was pulled back over a guidewire and removed. The 5 French sheath was then removed and hemostasis obtained with direct pressure. The patient tolerated the procedure well and there were no complications. The patient was taken to the holding area in stable condition.  Operative management: The patient's arteriogram did not correlate with the previous CTA which suggested a high-grade right internal carotid artery stenosis. Patient is asymptomatic with a 55% right internal carotid artery stenosis. We will continue to follow this serially with carotid duplex exam. The patient will be scheduled for repeat carotid duplex in 6 months.  Ruta Hinds, MD [facility] [place] Office: [phone] Pager: [phone]

## 2016-04-23 ENCOUNTER — Ambulatory Visit: Payer: Medicare Other | Admitting: Vascular Surgery

## 2016-05-21 NOTE — Consult Note (Signed)
NAME:  CHANDLER, ANDERSEN NO.:  [id number]  MEDICAL RECORD NO.:  [id number]  LOCATION:                                 FACILITY:  PHYSICIAN:  Jaisean Monteforte K. Dreana Britz, M.D.DATE OF BIRTH:  1950/12/25  DATE OF CONSULTATION: DATE OF DISCHARGE:                                CONSULTATION   CLINICAL HISTORY:  The patient is here with right carotid artery stenosis.  EXAMINATION:  Intracranial interpretation of right common carotid arteriogram.  FINDINGS:  The right internal carotid artery in the cervical segment demonstrates wide patency.  The petrous, the cavernous and the supraclinoid segments are widely patent.  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  The right middle cerebral artery and the right anterior cerebral artery opacify into the capillary and venous phases.  Transverse opacification via the right posterior communicating artery.  The right posterior cerebral artery distribution was seen.  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  Otherwise no occlusions, stenoses or dissections were seen intracranially.          ______________________________ Fritz Pickerel Estanislado Pandy, M.D.     SKD/MEDQ  D:  05/20/2016  T:  05/21/2016  Job:  [id number]

## 2016-05-22 NOTE — Consult Note (Signed)
NAME:  NATALIA, GIEBEL NO.:  [id number]  MEDICAL RECORD NO.:  [id number]  LOCATION:                                 FACILITY:  PHYSICIAN:  Manuela Halbur K. Adelaida Reindel, M.D.DATE OF BIRTH:  04/13/1951  DATE OF CONSULTATION: DATE OF DISCHARGE:                                CONSULTATION   CLINICAL HISTORY:  Right carotid artery stenosis.  EXAMINATION:  Intracranial interpretation of right common carotid arteriogram.  FINDINGS:  The right common carotid arteriogram demonstrates wide patency of the right internal carotid artery in the distal cervical segment.  The petrous, cavernous and the supraclinoid segments are widely patent.  Right posterior communicating artery seen opacifying the right posterior cerebral artery distribution.  The right middle cerebral artery and the right anterior cerebral artery opacifying normally into the capillary and the venous phases.  Of note, is a focal outpouching involving the inferior aspect of the right internal carotid artery at the petrous-cavernous junction.  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  IMPRESSION:  No occlusions, stenosis, dissections noted intracranially, only these presented.  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          ______________________________ Fritz Pickerel Estanislado Pandy, M.D.     SKD/MEDQ  D:  05/20/2016  T:  05/21/2016  Job:  [id number]

## 2016-10-21 ENCOUNTER — Encounter (HOSPITAL_COMMUNITY): Payer: Medicare Other

## 2016-10-21 ENCOUNTER — Encounter: Payer: Self-pay | Admitting: Vascular Surgery

## 2016-10-21 ENCOUNTER — Other Ambulatory Visit (HOSPITAL_COMMUNITY): Payer: Medicare Other

## 2016-10-21 ENCOUNTER — Ambulatory Visit: Payer: Medicare Other | Admitting: Vascular Surgery

## 2016-10-28 ENCOUNTER — Ambulatory Visit (INDEPENDENT_AMBULATORY_CARE_PROVIDER_SITE_OTHER)
Admission: RE | Admit: 2016-10-28 | Discharge: 2016-10-28 | Disposition: A | Discharge status: Home / Self Care | Payer: Medicare Other | Source: Ambulatory Visit | Attending: Vascular Surgery | Admitting: Vascular Surgery

## 2016-10-28 ENCOUNTER — Ambulatory Visit (HOSPITAL_COMMUNITY)
Admission: RE | Admit: 2016-10-28 | Discharge: 2016-10-28 | Disposition: A | Discharge status: Home / Self Care | Payer: Medicare Other | Source: Ambulatory Visit | Attending: Vascular Surgery | Admitting: Vascular Surgery

## 2016-10-28 ENCOUNTER — Encounter: Payer: Self-pay | Admitting: Vascular Surgery

## 2016-10-28 ENCOUNTER — Ambulatory Visit (INDEPENDENT_AMBULATORY_CARE_PROVIDER_SITE_OTHER): Payer: Medicare Other | Admitting: Vascular Surgery

## 2016-10-28 VITALS — BP 130/73 | HR 58 | Temp 97.1°F | Resp 16 | Ht 64.0 in | Wt 156.0 lb

## 2016-10-28 DIAGNOSIS — I739 Peripheral vascular disease, unspecified: Secondary | ICD-10-CM | POA: Insufficient documentation

## 2016-10-28 DIAGNOSIS — Z95828 Presence of other vascular implants and grafts: Secondary | ICD-10-CM

## 2016-10-28 DIAGNOSIS — I6523 Occlusion and stenosis of bilateral carotid arteries: Secondary | ICD-10-CM

## 2016-10-28 NOTE — Progress Notes (Signed)
Patient name: [name] [name] MRN: [id number] DOB: May 12, 1951 Sex: male  REASON FOR VISIT: Follow up of carotid disease and peripheral vascular disease.  HPI: [name] [name] is a 65 y.o. male who I last saw on 09/19/2014. The patient is had a left brain stroke in the past. At the time of the last visit patient had a 60-79% right carotid stenosis which was stable. On the left side was a 40-59% stenosis. Patient comes in for routine follow up study. We're also following him with peripheral vascular disease.   The history is actually low more complicated than that. He had previously had a CT angiogram which suggested a string sign in the right internal carotid artery. Since this did not agree with the duplex scan he underwent a cerebral arteriogram in September 2015 which showed there was a 70% smooth stenosis on the right. The patient has also had previous surgery on the right side of the neck with radiation therapy.  In addition, the patient is status post a right femoropopliteal bypass graft in 2004. I have been following him with left lower extremity claudication. He continues to have pain in the left calf which is brought on by ambulation and relieved with rest. He occasionally gets some hip claudication. He denies any history of rest pain or nonhealing ulcers.  He is on aspirin and is on a statin.  Past Medical History:  Diagnosis Date  . Cancer (HCC)    throat, lymph node  . Carotid artery occlusion   . GERD (gastroesophageal reflux disease)   . Hyperlipemia   . PVD (peripheral vascular disease) ([place])   . Stroke [facility])     Family History  Problem Relation Age of Onset  . Lung cancer Mother   . Cancer Mother   . CAD Father   . Heart disease Father     SOCIAL HISTORY: Social History  Substance Use Topics  . Smoking status: Former Smoker    Quit date: 03/14/2001  . Smokeless tobacco: Never Used  . Alcohol use No    No Known Allergies  Current Outpatient Prescriptions    Medication Sig Dispense Refill  . aspirin EC 325 MG tablet Take 325 mg by mouth daily.    Marland Kitchen atorvastatin (LIPITOR) 20 MG tablet Take 20 mg by mouth every evening.     . clopidogrel (PLAVIX) 75 MG tablet Take 1 tablet (75 mg total) by mouth daily. 30 tablet 1   No current facility-administered medications for this visit.     REVIEW OF SYSTEMS:  [X]  denotes positive finding, [ ]  denotes negative finding Cardiac  Comments:  Chest pain or chest pressure:    Shortness of breath upon exertion:    Short of breath when lying flat:    Irregular heart rhythm:        Vascular    Pain in calf, thigh, or hip brought on by ambulation: X   Pain in feet at night that wakes you up from your sleep:     Blood clot in your veins:    Leg swelling:         Pulmonary    Oxygen at home:    Productive cough:     Wheezing:         Neurologic    Sudden weakness in arms or legs:     Sudden numbness in arms or legs:     Sudden onset of difficulty speaking or slurred speech:    Temporary loss of vision in  one eye:     Problems with dizziness:         Gastrointestinal    Blood in stool:     Vomited blood:         Genitourinary    Burning when urinating:     Blood in urine:        Psychiatric    Major depression:         Hematologic    Bleeding problems:    Problems with blood clotting too easily:        Skin    Rashes or ulcers:        Constitutional    Fever or chills:      PHYSICAL EXAM: Vitals:   10/28/16 1245 10/28/16 1247  BP: 130/70 130/73  Pulse: (!) 54 (!) 58  Resp: 16   Temp: 97.1 F (36.2 C)   TempSrc: Oral   SpO2: 98%   Weight: 156 lb (70.8 kg)   Height: 5\' 4"  (1.626 m)     GENERAL: The patient is a well-nourished male, in no acute distress. The vital signs are documented above. CARDIAC: There is a regular rate and rhythm.  VASCULAR: I do not detect carotid bruits. On the right side he has a palpable femoral pulse and weakly palpable dorsalis pedis pulse. On  the left side he has a slightly diminished femoral pulse. I cannot palpate pedal pulses. He has no significant lower extremity swelling. PULMONARY: There is good air exchange bilaterally without wheezing or rales. ABDOMEN: Soft and non-tender with normal pitched bowel sounds.  MUSCULOSKELETAL: There are no major deformities or cyanosis. NEUROLOGIC: No focal weakness or paresthesias are detected. SKIN: There are no ulcers or rashes noted. PSYCHIATRIC: The patient has a normal affect.  DATA:   ARTERIAL DOPPLER STUDY: I have independently interpreted his arterial Doppler study.  On the right side, there is a biphasic posterior tibial signal and biphasic dorsalis pedis signal. ABI on the right is 94% with a toe pressure of 56 mmHg.  On the left side there is an absent posterior tibial signal with a monophasic dorsalis pedis signal. ABI is 43%.  DUPLEX OF RIGHT FEMOROPOPLITEAL BYPASS: Duplex of his right femoropopliteal bypass graft shows biphasic flow throughout the graft with out any areas of significant increased velocity.  CAROTID DUPLEX: I have independently interpreted his carotid duplex scan today.  On the right side there is a 60-79% stenosis which is stable.  On the left side there is a less than 39% stenosis.  MEDICAL ISSUES:  ASYMPTOMATIC 60-79% RIGHT CAROTID STENOSIS: This stenosis on the right has remained stable. He understands we would not consider carotid endarterectomy was the stenosis progressed to greater than 80%. In addition, if this stenosis progressed he might also need to be considered for carotid stenting given his previous radiation therapy to the neck. He has no significant left carotid stenosis. He is on aspirin and is on a statin. I will order a follow up carotid duplex scan in 6 months and we'll see the nurse practitioner at that time.  PERIPHERAL VASCULAR DISEASE: The patient is stable left lower extremity claudication. His right femoropopliteal bypass graft is  patent. I've encouraged him to stay as active as possible. Fortunately he is not a smoker. I've ordered a graft duplex and ABIs in 1 year.   Deitra Mayo [facility] [place] [phone]

## 2016-10-29 NOTE — Addendum Note (Signed)
Addended by: Lianne Cure A on: 10/29/2016 08:23 AM   Modules accepted: Orders

## 2017-03-11 ENCOUNTER — Encounter (HOSPITAL_COMMUNITY): Payer: Self-pay | Admitting: *Deleted

## 2017-03-11 ENCOUNTER — Emergency Department (HOSPITAL_COMMUNITY)
Admission: EM | Admit: 2017-03-11 | Discharge: 2017-03-11 | Disposition: A | Discharge status: Other Acute Inpatient Hospital | Payer: Medicare Other | Attending: Emergency Medicine | Admitting: Emergency Medicine

## 2017-03-11 ENCOUNTER — Emergency Department (HOSPITAL_COMMUNITY): Payer: Medicare Other

## 2017-03-11 DIAGNOSIS — I639 Cerebral infarction, unspecified: Secondary | ICD-10-CM | POA: Diagnosis not present

## 2017-03-11 DIAGNOSIS — Z8521 Personal history of malignant neoplasm of larynx: Secondary | ICD-10-CM | POA: Diagnosis not present

## 2017-03-11 DIAGNOSIS — I69351 Hemiplegia and hemiparesis following cerebral infarction affecting right dominant side: Secondary | ICD-10-CM | POA: Diagnosis not present

## 2017-03-11 DIAGNOSIS — Z7982 Long term (current) use of aspirin: Secondary | ICD-10-CM | POA: Diagnosis not present

## 2017-03-11 DIAGNOSIS — Z9221 Personal history of antineoplastic chemotherapy: Secondary | ICD-10-CM | POA: Diagnosis not present

## 2017-03-11 DIAGNOSIS — R200 Anesthesia of skin: Secondary | ICD-10-CM | POA: Insufficient documentation

## 2017-03-11 DIAGNOSIS — Z6824 Body mass index (BMI) 24.0-24.9, adult: Secondary | ICD-10-CM | POA: Diagnosis not present

## 2017-03-11 DIAGNOSIS — R531 Weakness: Secondary | ICD-10-CM | POA: Diagnosis not present

## 2017-03-11 DIAGNOSIS — D72829 Elevated white blood cell count, unspecified: Secondary | ICD-10-CM | POA: Insufficient documentation

## 2017-03-11 DIAGNOSIS — I69398 Other sequelae of cerebral infarction: Secondary | ICD-10-CM | POA: Diagnosis not present

## 2017-03-11 DIAGNOSIS — D471 Chronic myeloproliferative disease: Secondary | ICD-10-CM | POA: Diagnosis not present

## 2017-03-11 DIAGNOSIS — D630 Anemia in neoplastic disease: Secondary | ICD-10-CM | POA: Diagnosis present

## 2017-03-11 DIAGNOSIS — C9210 Chronic myeloid leukemia, BCR/ABL-positive, not having achieved remission: Secondary | ICD-10-CM | POA: Diagnosis present

## 2017-03-11 DIAGNOSIS — Z452 Encounter for adjustment and management of vascular access device: Secondary | ICD-10-CM | POA: Diagnosis not present

## 2017-03-11 DIAGNOSIS — Z923 Personal history of irradiation: Secondary | ICD-10-CM | POA: Diagnosis not present

## 2017-03-11 DIAGNOSIS — D696 Thrombocytopenia, unspecified: Secondary | ICD-10-CM | POA: Diagnosis not present

## 2017-03-11 DIAGNOSIS — Z8673 Personal history of transient ischemic attack (TIA), and cerebral infarction without residual deficits: Secondary | ICD-10-CM | POA: Diagnosis not present

## 2017-03-11 DIAGNOSIS — Z87891 Personal history of nicotine dependence: Secondary | ICD-10-CM | POA: Diagnosis not present

## 2017-03-11 DIAGNOSIS — D649 Anemia, unspecified: Secondary | ICD-10-CM | POA: Diagnosis not present

## 2017-03-11 DIAGNOSIS — I272 Pulmonary hypertension, unspecified: Secondary | ICD-10-CM | POA: Diagnosis not present

## 2017-03-11 DIAGNOSIS — I340 Nonrheumatic mitral (valve) insufficiency: Secondary | ICD-10-CM | POA: Diagnosis not present

## 2017-03-11 DIAGNOSIS — Z66 Do not resuscitate: Secondary | ICD-10-CM | POA: Diagnosis not present

## 2017-03-11 DIAGNOSIS — Z7902 Long term (current) use of antithrombotics/antiplatelets: Secondary | ICD-10-CM | POA: Insufficient documentation

## 2017-03-11 DIAGNOSIS — E669 Obesity, unspecified: Secondary | ICD-10-CM | POA: Diagnosis present

## 2017-03-11 DIAGNOSIS — I69391 Dysphagia following cerebral infarction: Secondary | ICD-10-CM | POA: Diagnosis not present

## 2017-03-11 DIAGNOSIS — Z79899 Other long term (current) drug therapy: Secondary | ICD-10-CM | POA: Insufficient documentation

## 2017-03-11 DIAGNOSIS — I739 Peripheral vascular disease, unspecified: Secondary | ICD-10-CM | POA: Diagnosis present

## 2017-03-11 DIAGNOSIS — E785 Hyperlipidemia, unspecified: Secondary | ICD-10-CM | POA: Diagnosis present

## 2017-03-11 DIAGNOSIS — C9500 Acute leukemia of unspecified cell type not having achieved remission: Secondary | ICD-10-CM | POA: Diagnosis not present

## 2017-03-11 DIAGNOSIS — N179 Acute kidney failure, unspecified: Secondary | ICD-10-CM | POA: Diagnosis present

## 2017-03-11 DIAGNOSIS — Z8589 Personal history of malignant neoplasm of other organs and systems: Secondary | ICD-10-CM | POA: Diagnosis not present

## 2017-03-11 DIAGNOSIS — R740 Nonspecific elevation of levels of transaminase and lactic acid dehydrogenase [LDH]: Secondary | ICD-10-CM | POA: Diagnosis present

## 2017-03-11 DIAGNOSIS — R197 Diarrhea, unspecified: Secondary | ICD-10-CM | POA: Diagnosis not present

## 2017-03-11 DIAGNOSIS — R42 Dizziness and giddiness: Secondary | ICD-10-CM | POA: Diagnosis not present

## 2017-03-11 DIAGNOSIS — D469 Myelodysplastic syndrome, unspecified: Secondary | ICD-10-CM | POA: Diagnosis present

## 2017-03-11 DIAGNOSIS — M79606 Pain in leg, unspecified: Secondary | ICD-10-CM | POA: Diagnosis not present

## 2017-03-11 DIAGNOSIS — I6789 Other cerebrovascular disease: Secondary | ICD-10-CM | POA: Diagnosis not present

## 2017-03-11 DIAGNOSIS — R29810 Facial weakness: Secondary | ICD-10-CM | POA: Diagnosis present

## 2017-03-11 DIAGNOSIS — R110 Nausea: Secondary | ICD-10-CM | POA: Diagnosis not present

## 2017-03-11 DIAGNOSIS — D6959 Other secondary thrombocytopenia: Secondary | ICD-10-CM | POA: Diagnosis present

## 2017-03-11 DIAGNOSIS — E883 Tumor lysis syndrome: Secondary | ICD-10-CM | POA: Diagnosis present

## 2017-03-11 LAB — URINALYSIS, ROUTINE W REFLEX MICROSCOPIC
Bilirubin Urine: NEGATIVE
GLUCOSE, UA: NEGATIVE mg/dL
KETONES UR: NEGATIVE mg/dL
NITRITE: NEGATIVE
PH: 5 (ref 5.0–8.0)
Protein, ur: 30 mg/dL — AB
SPECIFIC GRAVITY, URINE: 1.016 (ref 1.005–1.030)

## 2017-03-11 LAB — BASIC METABOLIC PANEL
ANION GAP: 13 (ref 5–15)
BUN: 33 mg/dL — AB (ref 6–20)
CO2: 20 mmol/L — ABNORMAL LOW (ref 22–32)
Calcium: 9.1 mg/dL (ref 8.9–10.3)
Chloride: 105 mmol/L (ref 101–111)
Creatinine, Ser: 3.27 mg/dL — ABNORMAL HIGH (ref 0.61–1.24)
GFR calc Af Amer: 21 mL/min — ABNORMAL LOW (ref >60)
GFR, EST NON AFRICAN AMERICAN: 18 mL/min — AB (ref >60)
Glucose, Bld: 123 mg/dL — ABNORMAL HIGH (ref 65–99)
POTASSIUM: 3.6 mmol/L (ref 3.5–5.1)
SODIUM: 138 mmol/L (ref 135–145)

## 2017-03-11 LAB — DIFFERENTIAL

## 2017-03-11 LAB — PATHOLOGIST SMEAR REVIEW

## 2017-03-11 LAB — CBC
HCT: 23.9 % — ABNORMAL LOW (ref 39.0–52.0)
Hemoglobin: 6.7 g/dL — CL (ref 13.0–17.0)
MCH: 26.1 pg (ref 26.0–34.0)
MCHC: 28 g/dL — ABNORMAL LOW (ref 30.0–36.0)
MCV: 93 fL (ref 78.0–100.0)
Platelets: 123 10*3/uL — ABNORMAL LOW (ref 150–400)
RBC: 2.57 MIL/uL — ABNORMAL LOW (ref 4.22–5.81)
RDW: 21.5 % — ABNORMAL HIGH (ref 11.5–15.5)
WBC: 254.2 10*3/uL — AB (ref 4.0–10.5)

## 2017-03-11 LAB — SAVE SMEAR

## 2017-03-11 MED ORDER — SODIUM CHLORIDE 0.9 % IV BOLUS (SEPSIS)
1000.0000 mL | Freq: Once | INTRAVENOUS | Status: AC
  Administered 2017-03-11: 1000 mL via INTRAVENOUS

## 2017-03-11 NOTE — ED Notes (Signed)
Lab called critical lab WBC 254 Doctor notified.

## 2017-03-11 NOTE — ED Triage Notes (Signed)
Per EMS- pt was sitting in his car when he began having rt leg numbness. Pt has arterial problems in his legs with hx of intermittent numbness. Pt called EMS and upon arrival numbness had resolved. Pt now reports lightheadedness. No new neuro deficits noted. Pt had previous stroke with rt side residual

## 2017-03-11 NOTE — ED Notes (Signed)
Pt to CT at this time.

## 2017-03-11 NOTE — ED Notes (Signed)
Patient being transported from triage to room.

## 2017-03-11 NOTE — ED Notes (Signed)
Lab called with hgb 6.7 and critically high WBC count which they are confirming with smear and will re notify.  Dr. Ralene Bathe aware and patient being placed in to A1 now

## 2017-03-11 NOTE — ED Notes (Signed)
Patient in room

## 2017-03-11 NOTE — ED Provider Notes (Signed)
[facility] DEPT Provider Note   CSN: [id number] Arrival date & time: 03/11/17  1316     History   Chief Complaint Chief Complaint  Patient presents with  . Dizziness    HPI [name] [name] is a 66 y.o. male.  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 The patient states that approximately 9 AM this morning he was in his car when he had a sudden onset of numbness in the right upper thigh. States that he walked around outside of the car which finally self resolved. States that he drove himself home and went to lunch with his wife. States that during lunch she became lightheaded and dizzy. Denies any room spinning sensation. Felt like he may pass out but had no syncopal episode. States that at that point his wife called the EMS to have him transported to the ED for evaluation. The patient states he does have history of peripheral vascular disease that has required surgical intervention by vascular surgery last year. The patient also has extensive carotid disease currently receiving medical management. The patient states that his left calf has been hurting with spasms over the past few weeks. Denies any lower extremity edema. Denies any of this time. Also reports a nonproductive cough for the past few weeks. Denies any associated chest pain, shortness of breath, hemoptysis, fever. The patient states that all his symptoms have resolved at this time. He denies any complaints at this time.  Pt denies any fever, chill, ha, vision changes, lightheadedness, dizziness, congestion, neck pain, cp, sob, cough, abd pain, n/v/d, urinary symptoms,  change in bowel habits, melena, hematochezia, lower extremity paresthesias.  Past Medical History:  Diagnosis Date  . Cancer (HCC)    throat, lymph node  . Carotid artery occlusion   . GERD (gastroesophageal reflux disease)   . Hyperlipemia   . PVD (peripheral vascular disease) ([place])   . Stroke [facility])     Patient Active Problem List   Diagnosis Date Noted  . Cerebrovascular accident (CVA) due to thrombosis of left middle cerebral artery ([place]) 08/01/2015  . Dysphagia 04/25/2014  . Occlusion and stenosis of carotid artery without mention of cerebral infarction 03/14/2014  . Carotid stenosis 01/16/2014  . Right sided weakness 01/13/2014  . Acute ischemic stroke ([place]) 01/13/2014  . PVD (peripheral vascular disease) ([place]) 01/13/2014  . Other and unspecified hyperlipidemia 01/13/2014  . GERD (gastroesophageal reflux disease) 01/13/2014  . Squamous cell carcinoma of soft palate ([place]) 01/13/2014    Past Surgical History:  Procedure Laterality Date  . ARCH AORTOGRAM N/A 03/19/2014   Procedure: ARCH AORTOGRAM;  Surgeon: Angelia Mould, MD;  Location: [facility] CATH LAB;  Service: Cardiovascular;  Laterality: N/A;  . CEREBRAL ANGIOGRAM N/A 03/19/2014   Procedure: CEREBRAL ANGIOGRAM;  Surgeon: Angelia Mould, MD;  Location: [facility] CATH LAB;  Service: Cardiovascular;  Laterality: N/A;  . CHOLECYSTECTOMY    . LYMPHADENECTOMY Right 2009   neck  . PERIPHERAL VASCULAR CATHETERIZATION Right 04/17/2016   Procedure: Carotid PTA/Stent Intervention;  Surgeon: Elam Dutch, MD;  Location: [facility] CV LAB;  Service: Cardiovascular;  Laterality: Right;  . PR VEIN BYPASS GRAFT,AORTO-FEM-POP         Home Medications  Prior to Admission medications   Medication Sig Start Date End Date Taking? Authorizing Provider  aspirin EC 325 MG tablet Take 325 mg by mouth daily.    [provider]  atorvastatin (LIPITOR) 20 MG tablet Take 20 mg by mouth every evening.     [provider]  clopidogrel (PLAVIX) 75 MG tablet Take 1 tablet (75 mg total) by mouth daily. 02/14/14   Rosalin Hawking, MD    Family History Family History  Problem Relation Age of Onset  . Lung cancer Mother   . Cancer Mother   . CAD Father   . Heart disease Father     Social History Social History  Substance Use Topics  . Smoking status: Former Smoker    Quit date: 03/14/2001  . Smokeless tobacco: Never Used  . Alcohol use No     Allergies   Patient has no known allergies.   Review of Systems Review of Systems  Constitutional: Negative for chills and fever.  HENT: Negative for congestion and sore throat.   Eyes: Negative for visual disturbance.  Respiratory: Negative for cough and shortness of breath.   Cardiovascular: Negative for chest pain.  Gastrointestinal: Negative for abdominal pain, blood in stool, diarrhea, nausea and vomiting.  Genitourinary: Negative for dysuria, flank pain, frequency, hematuria, scrotal swelling, testicular pain and urgency.  Musculoskeletal: Negative for arthralgias and myalgias.  Skin: Negative for rash.  Neurological: Positive for dizziness, light-headedness and numbness. Negative for syncope, weakness and headaches.  Psychiatric/Behavioral: Negative for sleep disturbance. The patient is not nervous/anxious.      Physical Exam Updated Vital Signs BP 122/66   Pulse 89   Temp 98 F (36.7 C) (Oral)   Resp (!) 22   SpO2 100%   Physical Exam  Constitutional: He is oriented to person, place, and time. He appears well-developed and well-nourished.  Non-toxic appearance. No distress.  Chronically ill appearing.  HENT:  Head: Normocephalic and atraumatic.  Mouth/Throat: Oropharynx is clear and moist.  Oropharynx is clear.  Eyes: Pupils are equal, round, and reactive to light. Conjunctivae and EOM are normal. Right eye exhibits no discharge. Left eye exhibits no discharge.  Neck: Normal range of motion. Neck supple.  Cardiovascular:  Normal rate, regular rhythm, normal heart sounds and intact distal pulses.  Exam reveals no gallop and no friction rub.   No murmur heard. Pulmonary/Chest: Effort normal and breath sounds normal. No respiratory distress. He has no decreased breath sounds. He has no wheezes. He has no rhonchi. He has no rales. He exhibits no tenderness.  Abdominal: Soft. Bowel sounds are normal. He exhibits no distension. There is no tenderness. There is no rebound and no guarding.  Musculoskeletal: Normal range of motion. He exhibits no tenderness.  Lymphadenopathy:    He has cervical adenopathy.  Neurological: He is alert and oriented to person, place, and time.  The patient is alert, attentive, and oriented x 3. Speech is clear. Cranial nerve II-VII grossly intact. Negative pronator drift. Sensation intact. Strength 5/5 in all extremities. Reflexes 2+ and symmetric at biceps, triceps, knees, and ankles. Rapid alternating movement and fine finger movements intact. Romberg is absent. Posture and gait normal.   Skin: Skin is warm and dry. Capillary refill takes less than 2 seconds. No rash noted. There is pallor.  Psychiatric: His behavior is normal. Judgment and thought content normal.  Nursing note and vitals reviewed.    ED Treatments / Results  Labs (all labs ordered are listed, but only abnormal  results are displayed) Labs Reviewed  BASIC METABOLIC PANEL - Abnormal; Notable for the following:       Result Value   CO2 20 (*)    Glucose, Bld 123 (*)    BUN 33 (*)    Creatinine, Ser 3.27 (*)    GFR calc non Af Amer 18 (*)    GFR calc Af Amer 21 (*)    All other components within normal limits  CBC - Abnormal; Notable for the following:    WBC 254.2 (*)    RBC 2.57 (*)    Hemoglobin 6.7 (*)    HCT 23.9 (*)    MCHC 28.0 (*)    RDW 21.5 (*)    Platelets 123 (*)    All other components within normal limits  PATHOLOGIST SMEAR REVIEW  URINALYSIS, ROUTINE W REFLEX MICROSCOPIC  DIFFERENTIAL  SAVE  SMEAR    EKG  EKG Interpretation None       Radiology Dg Chest 2 View  Result Date: 03/11/2017 CLINICAL DATA:  Right leg numbness. EXAM: CHEST  2 VIEW COMPARISON:  January 13, 2014 FINDINGS: Vascular crowding in the medial right lung base. The heart, hila, mediastinum, lungs, and pleura are otherwise normal. IMPRESSION: No active cardiopulmonary disease. Electronically Signed   By: Dorise Bullion III M.D   On: 03/11/2017 15:59   Ct Head Wo Contrast  Result Date: 03/11/2017 CLINICAL DATA:  TIA.  Episode of bilateral leg numbness today. EXAM: CT HEAD WITHOUT CONTRAST TECHNIQUE: Contiguous axial images were obtained from the base of the skull through the vertex without intravenous contrast. COMPARISON:  01/13/2014.  Brain MRI 01/13/2014 FINDINGS: Brain: No evidence of acute cortical infarct. There is chronic small vessel ischemia in the cerebral white matter with confluent hazy low-density around the lateral ventricles. Remote white matter infarcts with gliosis seen across the body of the corpus callosum. Subtle low-density across the right pons correlating with infarct seen by MRI in 2015. No hemorrhage, hydrocephalus, or masslike findings. Vascular: Atherosclerotic calcification.  No hyperdense vessel. Skull: No acute or aggressive finding. Sinuses/Orbits: No acute finding IMPRESSION: 1. No acute hemorrhage or visible acute infarct. 2. Chronic small vessel ischemia with remote cerebral white matter and right pontine infarcts. Electronically Signed   By: Monte Fantasia M.D.   On: 03/11/2017 16:15    Procedures Procedures (including critical care time)  Medications Ordered in ED Medications  sodium chloride 0.9 % bolus 1,000 mL (1,000 mLs Intravenous New Bag/Given 03/11/17 1540)     Initial Impression / Assessment and Plan / ED Course  I have reviewed the triage vital signs and the nursing notes.  Pertinent labs & imaging results that were available during my care of the patient were reviewed  by me and considered in my medical decision making (see chart for details).     The patient presents to the ED after having episodes of numbness to his right leg, lightheadedness, dizziness. These symptoms have since resolved. Patient also notes having left lower extremity calf pain and spasms over the past 2-3 weeks. Denies any other infectious symptoms.  Vital signs are reassuring. Patient is chronically ill-appearing but nontoxic.  Was notified from triage were patient had blood work drawn. Critical white blood cell count of 250,000, hemoglobin of 6.7, platelets of 123. Elevated creatinine of 3.27 from baseline of 1.3. BUN 33. Patient's blood was reviewed by pathologist that shows leukocytosis with immature cells concerning for hemopoietic neoplasm.  The patient has no focal neuro deficits on exam. Does  have some cervical adenopathy. Abdominal exam is benign. Lungs clear to auscultation bilaterally. No lower extremity edema noted. Patient is neurovascularly intact in all extremities. Moving extremities  appropriately.  EKG was obtained that showed normal sinus rhythm and no change from prior tracings. Given patient's neurological symptoms CT of head was performed. This was unremarkable for any acute findings. However will likely need MRI workup at some point. Chest x-ray was unremarkable.  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 Spoke with Dr. Filiberto Pinks with oncology that recommends immediate leukapheresis which we are unable to perform at [facility]. He recommends transfer to [facility] for admission he also recommends normal saline IV fluid bolus. Spoke with Dr. Florene Glen with the  leukemia specialiy team at [facility] who will accept patient in transfer. Patient is agreeable to transfer and is currently hemodynamically stable this time. No focal neuro deficits at this time.  I did speak with Dr. Rory Percy with neurology who recommends head CT however the patient needs emergent  oncological intervention at this time. Does not feel like neurology needs immediate intervention at this time.  Patient does complain of left lower extremity calf cramping. We'll need a venous ultrasound to rule out DVT at some point however do not feel this is a medical emergency at this time. The concern for patient's hematological condition and hypercoagulable state.   The patient remains hemodynamically stable and was transferred by EMS to [facility]. The patient and family was updated on plan of care and all questions were answered.   Pt was seen and dicussed with Dr. Sherry Ruffing who is agreeable to the above plan.   EMTALA was placed. Pt assessed prior to discharge and stable for transfer.   CRITICAL CARE Performed by: Ocie Cornfield   Total critical care time: 45 minutes  Critical care time was exclusive of separately billable procedures and treating other patients.  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 Requires emergency transfer to [facility] for immediate leukophoresis.   Critical care was necessary to treat or prevent imminent or life-threatening deterioration.  Critical care was time spent personally by me on the following activities: development of treatment plan with patient and/or surrogate as well as nursing, discussions with consultants, evaluation of patient's response to treatment, examination of patient, obtaining history from patient or surrogate, ordering and performing treatments and interventions, ordering and review of laboratory studies, ordering and review of radiographic studies, pulse oximetry and re-evaluation of patient's condition.  Final Clinical Impressions(s) / ED Diagnoses   Final diagnoses:  Numbness  Lightheadedness  Dizziness  Leukocytosis, unspecified type  Anemia, unspecified type  Thrombocytopenia [facility])    New Prescriptions New Prescriptions   No medications on file       Aaron Edelman 03/11/17 1735    Tegeler, Gwenyth Allegra, MD 03/18/17 1018

## 2017-03-19 DIAGNOSIS — C9500 Acute leukemia of unspecified cell type not having achieved remission: Secondary | ICD-10-CM | POA: Diagnosis not present

## 2017-03-19 DIAGNOSIS — Z9221 Personal history of antineoplastic chemotherapy: Secondary | ICD-10-CM | POA: Diagnosis not present

## 2017-03-19 DIAGNOSIS — Z8673 Personal history of transient ischemic attack (TIA), and cerebral infarction without residual deficits: Secondary | ICD-10-CM | POA: Diagnosis not present

## 2017-03-19 DIAGNOSIS — Z923 Personal history of irradiation: Secondary | ICD-10-CM | POA: Diagnosis not present

## 2017-03-19 DIAGNOSIS — Z7901 Long term (current) use of anticoagulants: Secondary | ICD-10-CM | POA: Diagnosis not present

## 2017-03-19 DIAGNOSIS — Z85828 Personal history of other malignant neoplasm of skin: Secondary | ICD-10-CM | POA: Diagnosis not present

## 2017-03-19 DIAGNOSIS — D469 Myelodysplastic syndrome, unspecified: Secondary | ICD-10-CM | POA: Diagnosis not present

## 2017-03-23 DIAGNOSIS — C9500 Acute leukemia of unspecified cell type not having achieved remission: Secondary | ICD-10-CM | POA: Diagnosis not present

## 2017-03-23 DIAGNOSIS — D469 Myelodysplastic syndrome, unspecified: Secondary | ICD-10-CM | POA: Diagnosis not present

## 2017-03-23 DIAGNOSIS — Z8673 Personal history of transient ischemic attack (TIA), and cerebral infarction without residual deficits: Secondary | ICD-10-CM | POA: Diagnosis not present

## 2017-03-26 DIAGNOSIS — Z85828 Personal history of other malignant neoplasm of skin: Secondary | ICD-10-CM | POA: Diagnosis not present

## 2017-03-26 DIAGNOSIS — Z8673 Personal history of transient ischemic attack (TIA), and cerebral infarction without residual deficits: Secondary | ICD-10-CM | POA: Diagnosis not present

## 2017-03-26 DIAGNOSIS — E785 Hyperlipidemia, unspecified: Secondary | ICD-10-CM | POA: Diagnosis not present

## 2017-03-26 DIAGNOSIS — Z9221 Personal history of antineoplastic chemotherapy: Secondary | ICD-10-CM | POA: Diagnosis not present

## 2017-03-26 DIAGNOSIS — D469 Myelodysplastic syndrome, unspecified: Secondary | ICD-10-CM | POA: Diagnosis not present

## 2017-03-26 DIAGNOSIS — Z7901 Long term (current) use of anticoagulants: Secondary | ICD-10-CM | POA: Diagnosis not present

## 2017-03-30 DIAGNOSIS — R634 Abnormal weight loss: Secondary | ICD-10-CM | POA: Diagnosis not present

## 2017-03-30 DIAGNOSIS — D469 Myelodysplastic syndrome, unspecified: Secondary | ICD-10-CM | POA: Diagnosis not present

## 2017-03-30 DIAGNOSIS — Z8673 Personal history of transient ischemic attack (TIA), and cerebral infarction without residual deficits: Secondary | ICD-10-CM | POA: Diagnosis not present

## 2017-03-30 DIAGNOSIS — E785 Hyperlipidemia, unspecified: Secondary | ICD-10-CM | POA: Diagnosis not present

## 2017-03-30 DIAGNOSIS — Z7901 Long term (current) use of anticoagulants: Secondary | ICD-10-CM | POA: Diagnosis not present

## 2017-04-05 DIAGNOSIS — Z5111 Encounter for antineoplastic chemotherapy: Secondary | ICD-10-CM | POA: Diagnosis not present

## 2017-04-05 DIAGNOSIS — D469 Myelodysplastic syndrome, unspecified: Secondary | ICD-10-CM | POA: Diagnosis not present

## 2017-04-06 DIAGNOSIS — D469 Myelodysplastic syndrome, unspecified: Secondary | ICD-10-CM | POA: Diagnosis not present

## 2017-04-06 DIAGNOSIS — Z23 Encounter for immunization: Secondary | ICD-10-CM | POA: Diagnosis not present

## 2017-04-06 DIAGNOSIS — Z5111 Encounter for antineoplastic chemotherapy: Secondary | ICD-10-CM | POA: Diagnosis not present

## 2017-04-07 DIAGNOSIS — D469 Myelodysplastic syndrome, unspecified: Secondary | ICD-10-CM | POA: Diagnosis not present

## 2017-04-07 DIAGNOSIS — Z5111 Encounter for antineoplastic chemotherapy: Secondary | ICD-10-CM | POA: Diagnosis not present

## 2017-04-08 DIAGNOSIS — D469 Myelodysplastic syndrome, unspecified: Secondary | ICD-10-CM | POA: Diagnosis not present

## 2017-04-08 DIAGNOSIS — Z5181 Encounter for therapeutic drug level monitoring: Secondary | ICD-10-CM | POA: Diagnosis not present

## 2017-04-08 DIAGNOSIS — Z79899 Other long term (current) drug therapy: Secondary | ICD-10-CM | POA: Diagnosis not present

## 2017-04-08 DIAGNOSIS — Z5111 Encounter for antineoplastic chemotherapy: Secondary | ICD-10-CM | POA: Diagnosis not present

## 2017-04-09 DIAGNOSIS — Z5181 Encounter for therapeutic drug level monitoring: Secondary | ICD-10-CM | POA: Diagnosis not present

## 2017-04-09 DIAGNOSIS — D469 Myelodysplastic syndrome, unspecified: Secondary | ICD-10-CM | POA: Diagnosis not present

## 2017-04-09 DIAGNOSIS — Z5111 Encounter for antineoplastic chemotherapy: Secondary | ICD-10-CM | POA: Diagnosis not present

## 2017-04-09 DIAGNOSIS — Z79899 Other long term (current) drug therapy: Secondary | ICD-10-CM | POA: Diagnosis not present

## 2017-04-10 DIAGNOSIS — Z5181 Encounter for therapeutic drug level monitoring: Secondary | ICD-10-CM | POA: Diagnosis not present

## 2017-04-10 DIAGNOSIS — D469 Myelodysplastic syndrome, unspecified: Secondary | ICD-10-CM | POA: Diagnosis not present

## 2017-04-10 DIAGNOSIS — Z79899 Other long term (current) drug therapy: Secondary | ICD-10-CM | POA: Diagnosis not present

## 2017-04-10 DIAGNOSIS — Z5111 Encounter for antineoplastic chemotherapy: Secondary | ICD-10-CM | POA: Diagnosis not present

## 2017-04-11 DIAGNOSIS — D469 Myelodysplastic syndrome, unspecified: Secondary | ICD-10-CM | POA: Diagnosis not present

## 2017-04-13 DIAGNOSIS — Z9221 Personal history of antineoplastic chemotherapy: Secondary | ICD-10-CM | POA: Diagnosis not present

## 2017-04-13 DIAGNOSIS — K439 Ventral hernia without obstruction or gangrene: Secondary | ICD-10-CM | POA: Diagnosis not present

## 2017-04-13 DIAGNOSIS — R05 Cough: Secondary | ICD-10-CM | POA: Diagnosis not present

## 2017-04-13 DIAGNOSIS — Z859 Personal history of malignant neoplasm, unspecified: Secondary | ICD-10-CM | POA: Diagnosis not present

## 2017-04-13 DIAGNOSIS — Z7982 Long term (current) use of aspirin: Secondary | ICD-10-CM | POA: Diagnosis not present

## 2017-04-13 DIAGNOSIS — Z7902 Long term (current) use of antithrombotics/antiplatelets: Secondary | ICD-10-CM | POA: Diagnosis not present

## 2017-04-13 DIAGNOSIS — Z452 Encounter for adjustment and management of vascular access device: Secondary | ICD-10-CM | POA: Diagnosis not present

## 2017-04-13 DIAGNOSIS — N179 Acute kidney failure, unspecified: Secondary | ICD-10-CM | POA: Diagnosis not present

## 2017-04-13 DIAGNOSIS — D469 Myelodysplastic syndrome, unspecified: Secondary | ICD-10-CM | POA: Diagnosis not present

## 2017-04-13 DIAGNOSIS — E883 Tumor lysis syndrome: Secondary | ICD-10-CM | POA: Diagnosis not present

## 2017-04-13 DIAGNOSIS — I69398 Other sequelae of cerebral infarction: Secondary | ICD-10-CM | POA: Diagnosis not present

## 2017-04-13 DIAGNOSIS — E785 Hyperlipidemia, unspecified: Secondary | ICD-10-CM | POA: Diagnosis not present

## 2017-04-27 DIAGNOSIS — T829XXA Unspecified complication of cardiac and vascular prosthetic device, implant and graft, initial encounter: Secondary | ICD-10-CM | POA: Diagnosis not present

## 2017-04-27 DIAGNOSIS — K5900 Constipation, unspecified: Secondary | ICD-10-CM | POA: Diagnosis not present

## 2017-04-27 DIAGNOSIS — Z7901 Long term (current) use of anticoagulants: Secondary | ICD-10-CM | POA: Diagnosis not present

## 2017-04-27 DIAGNOSIS — E785 Hyperlipidemia, unspecified: Secondary | ICD-10-CM | POA: Diagnosis not present

## 2017-04-27 DIAGNOSIS — Z8673 Personal history of transient ischemic attack (TIA), and cerebral infarction without residual deficits: Secondary | ICD-10-CM | POA: Diagnosis not present

## 2017-04-27 DIAGNOSIS — D469 Myelodysplastic syndrome, unspecified: Secondary | ICD-10-CM | POA: Diagnosis not present

## 2017-04-27 DIAGNOSIS — Z9221 Personal history of antineoplastic chemotherapy: Secondary | ICD-10-CM | POA: Diagnosis not present

## 2017-05-03 DIAGNOSIS — Z5181 Encounter for therapeutic drug level monitoring: Secondary | ICD-10-CM | POA: Diagnosis not present

## 2017-05-03 DIAGNOSIS — Z79899 Other long term (current) drug therapy: Secondary | ICD-10-CM | POA: Diagnosis not present

## 2017-05-03 DIAGNOSIS — Z5111 Encounter for antineoplastic chemotherapy: Secondary | ICD-10-CM | POA: Diagnosis not present

## 2017-05-03 DIAGNOSIS — D469 Myelodysplastic syndrome, unspecified: Secondary | ICD-10-CM | POA: Diagnosis not present

## 2017-05-04 DIAGNOSIS — Z5111 Encounter for antineoplastic chemotherapy: Secondary | ICD-10-CM | POA: Diagnosis not present

## 2017-05-04 DIAGNOSIS — Z5181 Encounter for therapeutic drug level monitoring: Secondary | ICD-10-CM | POA: Diagnosis not present

## 2017-05-04 DIAGNOSIS — Z79899 Other long term (current) drug therapy: Secondary | ICD-10-CM | POA: Diagnosis not present

## 2017-05-04 DIAGNOSIS — D469 Myelodysplastic syndrome, unspecified: Secondary | ICD-10-CM | POA: Diagnosis not present

## 2017-05-05 ENCOUNTER — Encounter (HOSPITAL_COMMUNITY): Payer: Medicare Other

## 2017-05-05 ENCOUNTER — Ambulatory Visit: Payer: Medicare Other | Admitting: Family

## 2017-05-05 DIAGNOSIS — Z79899 Other long term (current) drug therapy: Secondary | ICD-10-CM | POA: Diagnosis not present

## 2017-05-05 DIAGNOSIS — D469 Myelodysplastic syndrome, unspecified: Secondary | ICD-10-CM | POA: Diagnosis not present

## 2017-05-05 DIAGNOSIS — Z5181 Encounter for therapeutic drug level monitoring: Secondary | ICD-10-CM | POA: Diagnosis not present

## 2017-05-05 DIAGNOSIS — Z5111 Encounter for antineoplastic chemotherapy: Secondary | ICD-10-CM | POA: Diagnosis not present

## 2017-05-06 DIAGNOSIS — Z79899 Other long term (current) drug therapy: Secondary | ICD-10-CM | POA: Diagnosis not present

## 2017-05-06 DIAGNOSIS — Z5181 Encounter for therapeutic drug level monitoring: Secondary | ICD-10-CM | POA: Diagnosis not present

## 2017-05-06 DIAGNOSIS — Z5111 Encounter for antineoplastic chemotherapy: Secondary | ICD-10-CM | POA: Diagnosis not present

## 2017-05-06 DIAGNOSIS — D469 Myelodysplastic syndrome, unspecified: Secondary | ICD-10-CM | POA: Diagnosis not present

## 2017-05-07 DIAGNOSIS — Z79899 Other long term (current) drug therapy: Secondary | ICD-10-CM | POA: Diagnosis not present

## 2017-05-07 DIAGNOSIS — Z5111 Encounter for antineoplastic chemotherapy: Secondary | ICD-10-CM | POA: Diagnosis not present

## 2017-05-07 DIAGNOSIS — D469 Myelodysplastic syndrome, unspecified: Secondary | ICD-10-CM | POA: Diagnosis not present

## 2017-05-07 DIAGNOSIS — Z5181 Encounter for therapeutic drug level monitoring: Secondary | ICD-10-CM | POA: Diagnosis not present

## 2017-05-10 DIAGNOSIS — D469 Myelodysplastic syndrome, unspecified: Secondary | ICD-10-CM | POA: Diagnosis not present

## 2017-05-10 DIAGNOSIS — Z5181 Encounter for therapeutic drug level monitoring: Secondary | ICD-10-CM | POA: Diagnosis not present

## 2017-05-10 DIAGNOSIS — Z79899 Other long term (current) drug therapy: Secondary | ICD-10-CM | POA: Diagnosis not present

## 2017-05-10 DIAGNOSIS — Z5111 Encounter for antineoplastic chemotherapy: Secondary | ICD-10-CM | POA: Diagnosis not present

## 2017-05-11 DIAGNOSIS — Z5111 Encounter for antineoplastic chemotherapy: Secondary | ICD-10-CM | POA: Diagnosis not present

## 2017-05-11 DIAGNOSIS — D469 Myelodysplastic syndrome, unspecified: Secondary | ICD-10-CM | POA: Diagnosis not present

## 2017-05-11 DIAGNOSIS — Z79899 Other long term (current) drug therapy: Secondary | ICD-10-CM | POA: Diagnosis not present

## 2017-05-11 DIAGNOSIS — Z5181 Encounter for therapeutic drug level monitoring: Secondary | ICD-10-CM | POA: Diagnosis not present

## 2017-05-13 DIAGNOSIS — D469 Myelodysplastic syndrome, unspecified: Secondary | ICD-10-CM | POA: Diagnosis not present

## 2017-05-18 DIAGNOSIS — D469 Myelodysplastic syndrome, unspecified: Secondary | ICD-10-CM | POA: Diagnosis not present

## 2017-05-25 DIAGNOSIS — R05 Cough: Secondary | ICD-10-CM | POA: Diagnosis not present

## 2017-05-25 DIAGNOSIS — D649 Anemia, unspecified: Secondary | ICD-10-CM | POA: Diagnosis not present

## 2017-05-25 DIAGNOSIS — Z79899 Other long term (current) drug therapy: Secondary | ICD-10-CM | POA: Diagnosis not present

## 2017-05-25 DIAGNOSIS — E785 Hyperlipidemia, unspecified: Secondary | ICD-10-CM | POA: Diagnosis not present

## 2017-05-25 DIAGNOSIS — Z9221 Personal history of antineoplastic chemotherapy: Secondary | ICD-10-CM | POA: Diagnosis not present

## 2017-05-25 DIAGNOSIS — Z7901 Long term (current) use of anticoagulants: Secondary | ICD-10-CM | POA: Diagnosis not present

## 2017-05-25 DIAGNOSIS — Z87891 Personal history of nicotine dependence: Secondary | ICD-10-CM | POA: Diagnosis not present

## 2017-05-25 DIAGNOSIS — Z8673 Personal history of transient ischemic attack (TIA), and cerebral infarction without residual deficits: Secondary | ICD-10-CM | POA: Diagnosis not present

## 2017-05-25 DIAGNOSIS — Z7982 Long term (current) use of aspirin: Secondary | ICD-10-CM | POA: Diagnosis not present

## 2017-05-25 DIAGNOSIS — Z7902 Long term (current) use of antithrombotics/antiplatelets: Secondary | ICD-10-CM | POA: Diagnosis not present

## 2017-05-25 DIAGNOSIS — D696 Thrombocytopenia, unspecified: Secondary | ICD-10-CM | POA: Diagnosis not present

## 2017-05-25 DIAGNOSIS — D469 Myelodysplastic syndrome, unspecified: Secondary | ICD-10-CM | POA: Diagnosis not present

## 2017-05-25 DIAGNOSIS — N179 Acute kidney failure, unspecified: Secondary | ICD-10-CM | POA: Diagnosis not present

## 2017-05-25 DIAGNOSIS — Z923 Personal history of irradiation: Secondary | ICD-10-CM | POA: Diagnosis not present

## 2017-05-25 DIAGNOSIS — Z85828 Personal history of other malignant neoplasm of skin: Secondary | ICD-10-CM | POA: Diagnosis not present

## 2017-05-25 DIAGNOSIS — R634 Abnormal weight loss: Secondary | ICD-10-CM | POA: Diagnosis not present

## 2017-05-26 ENCOUNTER — Ambulatory Visit (INDEPENDENT_AMBULATORY_CARE_PROVIDER_SITE_OTHER): Payer: Medicare Other | Admitting: Family

## 2017-05-26 ENCOUNTER — Ambulatory Visit: Payer: Medicare Other | Admitting: Family

## 2017-05-26 ENCOUNTER — Encounter (HOSPITAL_COMMUNITY): Payer: Medicare Other

## 2017-05-26 ENCOUNTER — Ambulatory Visit (HOSPITAL_COMMUNITY)
Admission: RE | Admit: 2017-05-26 | Discharge: 2017-05-26 | Disposition: A | Discharge status: Home / Self Care | Payer: Medicare Other | Source: Ambulatory Visit | Attending: Vascular Surgery | Admitting: Vascular Surgery

## 2017-05-26 ENCOUNTER — Encounter: Payer: Self-pay | Admitting: Family

## 2017-05-26 VITALS — BP 90/60 | HR 87 | Temp 97.1°F | Resp 19 | Wt 129.6 lb

## 2017-05-26 DIAGNOSIS — Z95828 Presence of other vascular implants and grafts: Secondary | ICD-10-CM

## 2017-05-26 DIAGNOSIS — Z923 Personal history of irradiation: Secondary | ICD-10-CM | POA: Diagnosis not present

## 2017-05-26 DIAGNOSIS — I779 Disorder of arteries and arterioles, unspecified: Secondary | ICD-10-CM

## 2017-05-26 DIAGNOSIS — I6523 Occlusion and stenosis of bilateral carotid arteries: Secondary | ICD-10-CM | POA: Diagnosis not present

## 2017-05-26 LAB — VAS US CAROTID
LCCADDIAS: 25 cm/s
LEFT ECA DIAS: -26 cm/s
LICAPDIAS: -70 cm/s
Left CCA dist sys: 72 cm/s
Left CCA prox dias: 43 cm/s
Left CCA prox sys: 126 cm/s
Left ICA dist dias: -63 cm/s
Left ICA dist sys: -114 cm/s
Left ICA prox sys: -157 cm/s
RCCADSYS: -60 cm/s
RCCAPSYS: 119 cm/s
RIGHT CCA MID DIAS: 31 cm/s
RIGHT ECA DIAS: -20 cm/s
Right CCA prox dias: 34 cm/s

## 2017-05-26 NOTE — Patient Instructions (Signed)
Stroke Prevention Some health problems and behaviors may make it more likely for you to have a stroke. Below are ways to lessen your risk of having a stroke.  Be active for at least 30 minutes on most or all days.  Do not smoke. Try not to be around others who smoke.  Do not drink too much alcohol. ? Do not have more than 2 drinks a day if you are a man. ? Do not have more than 1 drink a day if you are a woman and are not pregnant.  Eat healthy foods, such as fruits and vegetables. If you were put on a specific diet, follow the diet as told.  Keep your cholesterol levels under control through diet and medicines. Look for foods that are low in saturated fat, trans fat, cholesterol, and are high in fiber.  If you have diabetes, follow all diet plans and take your medicine as told.  Ask your doctor if you need treatment to lower your blood pressure. If you have high blood pressure (hypertension), follow all diet plans and take your medicine as told by your doctor.  If you are 18-39 years old, have your blood pressure checked every 3-5 years. If you are age 40 or older, have your blood pressure checked every year.  Keep a healthy weight. Eat foods that are low in calories, salt, saturated fat, trans fat, and cholesterol.  Do not take drugs.  Avoid birth control pills, if this applies. Talk to your doctor about the risks of taking birth control pills.  Talk to your doctor if you have sleep problems (sleep apnea).  Take all medicine as told by your doctor. ? You may be told to take aspirin or blood thinner medicine. Take this medicine as told by your doctor. ? Understand your medicine instructions.  Make sure any other conditions you have are being taken care of.  Get help right away if:  You suddenly lose feeling (you feel numb) or have weakness in your face, arm, or leg.  Your face or eyelid hangs down to one side.  You suddenly feel confused.  You have trouble talking  (aphasia) or understanding what people are saying.  You suddenly have trouble seeing in one or both eyes.  You suddenly have trouble walking.  You are dizzy.  You lose your balance or your movements are clumsy (uncoordinated).  You suddenly have a very bad headache and you do not know the cause.  You have new chest pain.  Your heart feels like it is fluttering or skipping a beat (irregular heartbeat). Do not wait to see if the symptoms above go away. Get help right away. Call your local emergency services (911 in [place] Do not drive yourself to the hospital. This information is not intended to replace advice given to you by your health care provider. Make sure you discuss any questions you have with your health care provider. Document Released: 12/22/2011 Document Revised: 11/28/2015 Document Reviewed: 12/23/2012 Elsevier Interactive Patient Education  2018 [employer]     Peripheral Vascular Disease Peripheral vascular disease (PVD) is a disease of the blood vessels that are not part of your heart and brain. A simple term for PVD is poor circulation. In most cases, PVD narrows the blood vessels that carry blood from your heart to the rest of your body. This can result in a decreased supply of blood to your arms, legs, and internal organs, like your stomach or kidneys. However, it most often affects   a person's lower legs and feet. There are two types of PVD.  Organic PVD. This is the more common type. It is caused by damage to the structure of blood vessels.  Functional PVD. This is caused by conditions that make blood vessels contract and tighten (spasm).  Without treatment, PVD tends to get worse over time. PVD can also lead to acute ischemic limb. This is when an arm or limb suddenly has trouble getting enough blood. This is a medical emergency. Follow these instructions at home:  Take medicines only as told by your doctor.  Do not use any tobacco products, including  cigarettes, chewing tobacco, or electronic cigarettes. If you need help quitting, ask your doctor.  Lose weight if you are overweight, and maintain a healthy weight as told by your doctor.  Eat a diet that is low in fat and cholesterol. If you need help, ask your doctor.  Exercise regularly. Ask your doctor for some good activities for you.  Take good care of your feet. ? Wear comfortable shoes that fit well. ? Check your feet often for any cuts or sores. Contact a doctor if:  You have cramps in your legs while walking.  You have leg pain when you are at rest.  You have coldness in a leg or foot.  Your skin changes.  You are unable to get or have an erection (erectile dysfunction).  You have cuts or sores on your feet that are not healing. Get help right away if:  Your arm or leg turns cold and blue.  Your arms or legs become red, warm, swollen, painful, or numb.  You have chest pain or trouble breathing.  You suddenly have weakness in your face, arm, or leg.  You become very confused or you cannot speak.  You suddenly have a very bad headache.  You suddenly cannot see. This information is not intended to replace advice given to you by your health care provider. Make sure you discuss any questions you have with your health care provider. Document Released: 09/16/2009 Document Revised: 11/28/2015 Document Reviewed: 11/30/2013 Elsevier Interactive Patient Education  2017 [employer] Inc.  

## 2017-05-26 NOTE — Progress Notes (Signed)
[facility] [place] HISTORY AND PHYSICAL   CC: Follow up extracranial carotid artery stenosis and peripheral artery occlusive disease   History of Present Illness:   [name] [name] is a 66 y.o. male patient of Dr. 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 Carotid duplex suggested a moderate carotid stenosis on the left and a tight right carotid stenosis. CT angiogram suggested a string sign on the right with no significant stenosis on the left. This reason, given the discrepancy, he underwent a cerebral arteriogram on 03/19/2014. This showed no significant disease of the aortic arch. On the right side, which was the asymptomatic side, there was a 70% smooth carotid stenosis. Of note the patient has had previous surgery on the right side of the neck and also radiation therapy. On the left side, which was the symptomatic side there was no significant stenosis. There was slight irregularity but minimal plaque. Pt denies any subsequent stroke or TIA.   Dr. Scot Dock last evaluated pt on 10-28-16. At that time Dr. Scot Dock discussed with pt that we would not consider carotid endarterectomy unless the stenosis progressed to greater than 80%. In addition, if this stenosis progressed he might also need to be considered for carotid stenting given his previous radiation therapy to the neck. He has no significant left carotid stenosis.The patient had stable left lower extremity claudication. His right femoropopliteal bypass graft was patent.  Dr. Allean Found performed a prosthetic PTFE bypass on the right leg and a subsequent redo bypass with vein and tibial thrombectomy for an ischemic right lower extremity in 2004.  He has left calf claudication after walking about 5+ minutes, relieved by a few minutes of rest, denies non healing wounds.  ABI result on 10/31/09: right was 0.92, left was 0.40.  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    He was recently diagnosed with myelodysplastic syndrome, has a port, is receiving chemotherapy at [facility] in [facility].   Patient has not had previous carotid artery intervention.  Pt states he was told by his PCP at the [facility] that he should not take NSAID's as he has a problem with vessels leading to his kidneys as found on an ultrasound. Pt's neurologist is Dr. Erlinda Hong.   Pt Diabetic: no Pt smoker: former smoker, quit in 2002  Pt meds include: Statin : yes ASA: no Other anticoagulants/antiplatelets: Plavix   Current Outpatient Medications  Medication Sig Dispense Refill  . allopurinol (ZYLOPRIM) 100 MG tablet Take 200 mg by mouth.    Marland Kitchen aspirin EC 325 MG tablet Take 325 mg by mouth daily.    Marland Kitchen atorvastatin (LIPITOR) 40 MG tablet Take 20 mg by mouth at bedtime.    . clopidogrel (PLAVIX) 75 MG tablet Take 1 tablet (75 mg total) by mouth daily. 30 tablet 1  . hydroxyurea (HYDREA) 500 MG capsule Take 500 mg by mouth.    . loratadine (CLARITIN) 10 MG tablet Take 10 mg by mouth at bedtime.      No current facility-administered medications for this visit.     Past Medical History:  Diagnosis Date  . Cancer (HCC)    throat, lymph node  . Carotid artery occlusion   . GERD (gastroesophageal reflux disease)   . Hyperlipemia   . PVD (peripheral vascular disease) ([place])   . Stroke [facility])     Social History Social History   Tobacco Use  . Smoking  status: Former Smoker    Last attempt to quit: 03/14/2001    Years since quitting: 16.2  . Smokeless tobacco: Never Used  Substance Use Topics  . Alcohol use: No    Alcohol/week: 0.0 oz  . Drug use: No    Family History Family History  Problem Relation Age of Onset  . Lung cancer Mother   . Cancer Mother   . CAD Father   . Heart disease Father     Surgical History Past Surgical History:  Procedure Laterality Date  . ARCH AORTOGRAM N/A 03/19/2014   Procedure: ARCH AORTOGRAM;  Surgeon: Angelia Mould, MD;  Location: [facility] CATH LAB;  Service: Cardiovascular;  Laterality: N/A;  . CEREBRAL ANGIOGRAM N/A 03/19/2014   Procedure: CEREBRAL ANGIOGRAM;  Surgeon: Angelia Mould, MD;  Location: [facility] CATH LAB;  Service: Cardiovascular;  Laterality: N/A;  . CHOLECYSTECTOMY    . LYMPHADENECTOMY Right 2009   neck  . PERIPHERAL VASCULAR CATHETERIZATION Right 04/17/2016   Procedure: Carotid PTA/Stent Intervention;  Surgeon: Elam Dutch, MD;  Location: [facility] CV LAB;  Service: Cardiovascular;  Laterality: Right;  . PR VEIN BYPASS GRAFT,AORTO-FEM-POP      No Known Allergies  Current Outpatient Medications  Medication Sig Dispense Refill  . allopurinol (ZYLOPRIM) 100 MG tablet Take 200 mg by mouth.    Marland Kitchen aspirin EC 325 MG tablet Take 325 mg by mouth daily.    Marland Kitchen atorvastatin (LIPITOR) 40 MG tablet Take 20 mg by mouth at bedtime.    . clopidogrel (PLAVIX) 75 MG tablet Take 1 tablet (75 mg total) by mouth daily. 30 tablet 1  . hydroxyurea (HYDREA) 500 MG capsule Take 500 mg by mouth.    . loratadine (CLARITIN) 10 MG tablet Take 10 mg by mouth at bedtime.      No current facility-administered medications for this visit.      REVIEW OF SYSTEMS: See HPI for pertinent positives and negatives.  Physical Examination Vitals:   05/26/17 1333 05/26/17 1335  BP: (!) 103/56 90/60  Pulse: 87   Resp: 19   Temp: (!) 97.1 F (36.2 C)   TempSrc: Oral   SpO2: 100%   Weight: 129 lb 9.6 oz (58.8 kg)    Body mass index is 22.25 kg/m.  General: WDWN male in NAD Neck: Sclerosed tissue at right side of neck s/p lymphadenectomy in 2009 and radiation tx for tonsillar cancer. GAIT:slightly stilted Eyes: PERRLA Pulmonary: Respirations are non-labored, CTAB, good air movement, no rales, rhonchi, or wheezing.  Cardiac: regular rhythm and rate, no detected murmur.  VASCULAR EXAM Carotid Bruits Right Left   positive Negative    Abdominal aortic pulse is not palpable. Radial pulses  are 2+ palpable and equal.      LE Pulses Right Left   FEMORAL 3+ palpable 1+ palpable    POPLITEAL not palpable  not palpable   POSTERIOR TIBIAL not palpable  not palpable    DORSALIS PEDIS  ANTERIOR TIBIAL 1+ palpable  not palpable     Gastrointestinal: soft, nontender, BS WNL, no r/g, no palpable masses.  Musculoskeletal: No muscle atrophy/wasting. M/S 5/5 throughout except 4/5 in left LE, extremities without ischemic changes.  Skin: No rashes, no cellulitis, no noted ulcers.   Neurologic: A&O X 3; appropriate affect, speech is mostly fluent, slightly slurred, CN 2-12 intact, pain and light touch intact in extremities, Motor exam as listed above     ASSESSMENT:  [name] [name] is a 66 y.o. male who had  a left brain stroke in July 2015 as manifested by slurred speech. He has not had any subsequent neurological event. Dr. Scot Dock indicated in his 10-28-16 assessment that we would not consider right carotid endarterectomy unless the stenosis progressed to greater than 80%. If he did have significant progression of the disease on the right he would have to be considered for carotid stenting given his previous surgery in the right neck and previous radiation therapy.   His left calf claudication remains stable, with left pain calf after walking 5 minutes, relieved by rest. He has no signs of ischemia in his feet/legs.  No claudication in the right leg.  Fortunately he does not have DM and quit smoking in 2002. He takes Plavix and a statin on a regular basis.    DATA  Carotid Duplex (05/26/17): Right ICA: 80-99% stenosis. Left ICA: 60-79% stenosis. Bilateral vertebral artery flow is antegrade.  Bilateral subclavian artery waveforms are normal.  Velocities have increased in the bilateral ICA compared to the  exam on 10-28-16.   ABI (Date: 10-28-16):  R:   ABI: 0.94 ,   PT: bi  DP: bi  TBI:  0.52  L:   ABI: 0.43 DP,   PT: absent  DP: mono  TBI: 0.35 ABI result on 10/31/09: right was 0.92, left was 0.40.   PLAN:   Based on today's exam and non-invasive vascular lab results, and after discussing with Dr. Scot Dock, the patient will follow up in 1-2 weeks with Dr. Trula Slade or Dr. Oneida Alar, first available, to discuss possible right carotid artery stent placement.   I discussed in depth with the patient the nature of atherosclerosis, and emphasized the importance of maximal medical management including strict control of blood pressure, blood glucose, and lipid levels, obtaining regular exercise, and cessation of smoking.  The patient is aware that without maximal medical management the underlying atherosclerotic disease process will progress, limiting the benefit of any interventions.  The patient was given information about stroke prevention and what symptoms should prompt the patient to seek immediate medical care.  The patient was given information about PAD including signs, symptoms, treatment, what symptoms should prompt the patient to seek immediate medical care, and risk reduction measures to take.  Thank you for allowing [place] to participate in this patient's care.  Clemon Chambers, RN, MSN, FNP-C [facility] Office: [phone]  Clinic MD: Scot Dock 05/26/2017 1:49 PM

## 2017-05-31 DIAGNOSIS — D469 Myelodysplastic syndrome, unspecified: Secondary | ICD-10-CM | POA: Diagnosis not present

## 2017-05-31 DIAGNOSIS — Z5111 Encounter for antineoplastic chemotherapy: Secondary | ICD-10-CM | POA: Diagnosis not present

## 2017-06-01 DIAGNOSIS — D469 Myelodysplastic syndrome, unspecified: Secondary | ICD-10-CM | POA: Diagnosis not present

## 2017-06-01 DIAGNOSIS — Z5111 Encounter for antineoplastic chemotherapy: Secondary | ICD-10-CM | POA: Diagnosis not present

## 2017-06-01 DIAGNOSIS — Z79899 Other long term (current) drug therapy: Secondary | ICD-10-CM | POA: Diagnosis not present

## 2017-06-02 DIAGNOSIS — D469 Myelodysplastic syndrome, unspecified: Secondary | ICD-10-CM | POA: Diagnosis not present

## 2017-06-02 DIAGNOSIS — Z79899 Other long term (current) drug therapy: Secondary | ICD-10-CM | POA: Diagnosis not present

## 2017-06-02 DIAGNOSIS — Z5111 Encounter for antineoplastic chemotherapy: Secondary | ICD-10-CM | POA: Diagnosis not present

## 2017-06-03 DIAGNOSIS — D469 Myelodysplastic syndrome, unspecified: Secondary | ICD-10-CM | POA: Diagnosis not present

## 2017-06-03 DIAGNOSIS — Z5111 Encounter for antineoplastic chemotherapy: Secondary | ICD-10-CM | POA: Diagnosis not present

## 2017-06-03 DIAGNOSIS — Z79899 Other long term (current) drug therapy: Secondary | ICD-10-CM | POA: Diagnosis not present

## 2017-06-04 ENCOUNTER — Other Ambulatory Visit: Payer: Self-pay

## 2017-06-04 ENCOUNTER — Encounter (HOSPITAL_COMMUNITY): Payer: Self-pay | Admitting: Emergency Medicine

## 2017-06-04 ENCOUNTER — Emergency Department (HOSPITAL_COMMUNITY)
Admission: EM | Admit: 2017-06-04 | Discharge: 2017-06-05 | Disposition: A | Discharge status: Home / Self Care | Payer: Medicare Other | Attending: Emergency Medicine | Admitting: Emergency Medicine

## 2017-06-04 DIAGNOSIS — Z79899 Other long term (current) drug therapy: Secondary | ICD-10-CM | POA: Insufficient documentation

## 2017-06-04 DIAGNOSIS — Z8579 Personal history of other malignant neoplasms of lymphoid, hematopoietic and related tissues: Secondary | ICD-10-CM | POA: Diagnosis not present

## 2017-06-04 DIAGNOSIS — R040 Epistaxis: Secondary | ICD-10-CM | POA: Insufficient documentation

## 2017-06-04 DIAGNOSIS — Z7982 Long term (current) use of aspirin: Secondary | ICD-10-CM | POA: Insufficient documentation

## 2017-06-04 DIAGNOSIS — R0981 Nasal congestion: Secondary | ICD-10-CM | POA: Insufficient documentation

## 2017-06-04 DIAGNOSIS — Z87891 Personal history of nicotine dependence: Secondary | ICD-10-CM | POA: Insufficient documentation

## 2017-06-04 DIAGNOSIS — D469 Myelodysplastic syndrome, unspecified: Secondary | ICD-10-CM | POA: Diagnosis not present

## 2017-06-04 DIAGNOSIS — Z8673 Personal history of transient ischemic attack (TIA), and cerebral infarction without residual deficits: Secondary | ICD-10-CM | POA: Insufficient documentation

## 2017-06-04 DIAGNOSIS — Z5111 Encounter for antineoplastic chemotherapy: Secondary | ICD-10-CM | POA: Diagnosis not present

## 2017-06-04 DIAGNOSIS — Z7902 Long term (current) use of antithrombotics/antiplatelets: Secondary | ICD-10-CM | POA: Diagnosis not present

## 2017-06-04 LAB — I-STAT CHEM 8, ED
BUN: 25 mg/dL — AB (ref 6–20)
CHLORIDE: 100 mmol/L — AB (ref 101–111)
CREATININE: 2.6 mg/dL — AB (ref 0.61–1.24)
Calcium, Ion: 1.16 mmol/L (ref 1.15–1.40)
Glucose, Bld: 91 mg/dL (ref 65–99)
HEMATOCRIT: 30 % — AB (ref 39.0–52.0)
Hemoglobin: 10.2 g/dL — ABNORMAL LOW (ref 13.0–17.0)
Potassium: 4.1 mmol/L (ref 3.5–5.1)
SODIUM: 134 mmol/L — AB (ref 135–145)
TCO2: 23 mmol/L (ref 22–32)

## 2017-06-04 LAB — BASIC METABOLIC PANEL
ANION GAP: 8 (ref 5–15)
BUN: 24 mg/dL — ABNORMAL HIGH (ref 6–20)
CHLORIDE: 103 mmol/L (ref 101–111)
CO2: 22 mmol/L (ref 22–32)
Calcium: 8.9 mg/dL (ref 8.9–10.3)
Creatinine, Ser: 2.19 mg/dL — ABNORMAL HIGH (ref 0.61–1.24)
GFR calc Af Amer: 34 mL/min — ABNORMAL LOW (ref >60)
GFR, EST NON AFRICAN AMERICAN: 29 mL/min — AB (ref >60)
GLUCOSE: 90 mg/dL (ref 65–99)
POTASSIUM: 4 mmol/L (ref 3.5–5.1)
Sodium: 133 mmol/L — ABNORMAL LOW (ref 135–145)

## 2017-06-04 LAB — CBC WITH DIFFERENTIAL/PLATELET
HEMATOCRIT: 29.8 % — AB (ref 39.0–52.0)
HEMOGLOBIN: 9.5 g/dL — AB (ref 13.0–17.0)
MCH: 29 pg (ref 26.0–34.0)
MCHC: 31.9 g/dL (ref 30.0–36.0)
MCV: 90.9 fL (ref 78.0–100.0)
Platelets: 42 10*3/uL — ABNORMAL LOW (ref 150–400)
RBC: 3.28 MIL/uL — AB (ref 4.22–5.81)
RDW: 18.2 % — ABNORMAL HIGH (ref 11.5–15.5)
WBC: 40.6 10*3/uL — ABNORMAL HIGH (ref 4.0–10.5)

## 2017-06-04 MED ORDER — SILVER NITRATE-POT NITRATE 75-25 % EX MISC
1.0000 | Freq: Once | CUTANEOUS | Status: AC
  Administered 2017-06-04: 1 via TOPICAL
  Filled 2017-06-04: qty 1

## 2017-06-04 MED ORDER — PHENYLEPHRINE HCL 0.5 % NA SOLN
1.0000 [drp] | Freq: Once | NASAL | Status: AC
  Administered 2017-06-04: 1 [drp] via NASAL
  Filled 2017-06-04: qty 15

## 2017-06-04 NOTE — ED Notes (Signed)
Called pt to traige, no answer.

## 2017-06-04 NOTE — ED Triage Notes (Signed)
Pt c/o nose bleed from left nares, started yesterday-- on plavix- has gauze packing in left nares, placed per pt- is getting chemotherapy for chronic leukemia -- last treatment tody.  Pt also has a port a cath right chest

## 2017-06-04 NOTE — ED Provider Notes (Signed)
[place] EMERGENCY DEPARTMENT Provider Note   CSN: [id number] Arrival date & time: 06/04/17  1413     History   Chief Complaint Chief Complaint  Patient presents with  . Epistaxis    HPI BRADEN CIMO is a 66 y.o. male with a past medical history of leukemia, prior stroke now on Plavix, who presents to ED for evaluation of intermittent epistaxis from left nare since yesterday. He has tried Afrin with intermittent relief in his symptoms.  Does report a history of nosebleeds in the past but states that none have lasted this long.  He does report some nasal congestion and dryness.  He denies any other symptoms including lightheadedness and nauseousness, bleeding elsewhere.  HPI  Past Medical History:  Diagnosis Date  . Cancer (HCC)    throat, lymph node  . Carotid artery occlusion   . GERD (gastroesophageal reflux disease)   . Hyperlipemia   . PVD (peripheral vascular disease) ([place])   . Stroke [facility])     Patient Active Problem List   Diagnosis Date Noted  . Cerebrovascular accident (CVA) due to thrombosis of left middle cerebral artery ([place]) 08/01/2015  . Dysphagia 04/25/2014  . Occlusion and stenosis of carotid artery without mention of cerebral infarction 03/14/2014  . Carotid stenosis 01/16/2014  . Right sided weakness 01/13/2014  . Acute ischemic stroke ([place]) 01/13/2014  . PVD (peripheral vascular disease) ([place]) 01/13/2014  . Other and unspecified hyperlipidemia 01/13/2014  . GERD (gastroesophageal reflux disease) 01/13/2014  . 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 ARCH AORTOGRAM N/A 03/19/2014   Procedure: ARCH AORTOGRAM;  Surgeon: Angelia Mould, MD;  Location: [facility] CATH LAB;  Service: Cardiovascular;  Laterality: N/A;  . CEREBRAL ANGIOGRAM N/A 03/19/2014   Procedure: CEREBRAL ANGIOGRAM;  Surgeon: Angelia Mould, MD;  Location: [facility] CATH LAB;  Service:  Cardiovascular;  Laterality: N/A;  . CHOLECYSTECTOMY    . LYMPHADENECTOMY Right 2009   neck  . PERIPHERAL VASCULAR CATHETERIZATION Right 04/17/2016   Procedure: Carotid PTA/Stent Intervention;  Surgeon: Elam Dutch, MD;  Location: [facility] CV LAB;  Service: Cardiovascular;  Laterality: Right;  . PR VEIN BYPASS GRAFT,AORTO-FEM-POP         Home Medications    Prior to Admission medications   Medication Sig Start Date End Date Taking? Authorizing Provider  allopurinol (ZYLOPRIM) 100 MG tablet Take 200 mg by mouth. 05/25/17  Yes [provider]  aspirin EC 325 MG tablet Take 325 mg by mouth daily.   Yes [provider]  atorvastatin (LIPITOR) 40 MG tablet Take 20 mg by mouth at bedtime.   Yes [provider]  clopidogrel (PLAVIX) 75 MG tablet Take 1 tablet (75 mg total) by mouth daily. 02/14/14  Yes Rosalin Hawking, MD  hydroxyurea (HYDREA) 500 MG capsule Take 1,000 mg by mouth daily.  05/25/17 08/23/17 Yes [provider]  loratadine (CLARITIN) 10 MG tablet Take 10 mg by mouth at bedtime.    Yes [provider]    Family History Family History  Problem Relation Age of Onset  . Lung cancer Mother   . Cancer Mother   . CAD Father   . Heart disease Father     Social History Social History   Tobacco Use  . Smoking status: Former Smoker    Last attempt to quit: 03/14/2001    Years since quitting: 16.2  . Smokeless tobacco: Never Used  Substance Use Topics  . Alcohol use: No    Alcohol/week: 0.0 oz  . Drug use: No     Allergies   Patient has no known allergies.   Review of Systems Review of Systems  Constitutional: Negative for appetite change, chills and fever.  HENT: Positive for congestion and nosebleeds. Negative for ear pain, rhinorrhea, sneezing and sore throat.   Eyes: Negative for photophobia and visual disturbance.  Respiratory: Negative for cough, chest tightness, shortness of breath and wheezing.     Cardiovascular: Negative for chest pain and palpitations.  Gastrointestinal: Negative for abdominal pain, blood in stool, constipation, diarrhea, nausea and vomiting.  Genitourinary: Negative for dysuria, hematuria and urgency.  Musculoskeletal: Negative for myalgias.  Skin: Negative for rash.  Neurological: Negative for dizziness, weakness and light-headedness.     Physical Exam Updated Vital Signs BP (!) 120/58   Pulse 78   Temp 98.4 F (36.9 C) (Oral)   Resp 15   SpO2 97%   Physical Exam  Constitutional: He appears well-developed and well-nourished. No distress.  HENT:  Head: Normocephalic and atraumatic.  Nose: Epistaxis is observed.  Punctuate area of bleeding noted on the septum wall of the left nare. Clot noted in posterior pharynx.  Eyes: Conjunctivae and EOM are normal. Right eye exhibits no discharge. Left eye exhibits no discharge. No scleral icterus.  Neck: Normal range of motion. Neck supple.  Cardiovascular: Normal rate, regular rhythm, normal heart sounds and intact distal pulses. Exam reveals no gallop and no friction rub.  No murmur heard. Pulmonary/Chest: Effort normal and breath sounds normal. No respiratory distress.  Abdominal: Soft. Bowel sounds are normal. He exhibits no distension. There is no tenderness. There is no guarding.  Musculoskeletal: Normal range of motion. He exhibits no edema.  Neurological: He is alert. He exhibits normal muscle tone. Coordination normal.  Skin: Skin is warm and dry. No rash noted.  Psychiatric: He has a normal mood and affect.  Nursing note and vitals reviewed.    ED Treatments / Results  Labs (all labs ordered are listed, but only abnormal results are displayed) Labs Reviewed  CBC WITH DIFFERENTIAL/PLATELET - Abnormal; Notable for the following components:      Result Value   WBC 40.6 (*)    RBC 3.28 (*)    Hemoglobin 9.5 (*)    HCT 29.8 (*)    RDW 18.2 (*)    Platelets 42 (*)    All other components within  normal limits  BASIC METABOLIC PANEL - Abnormal; Notable for the following components:   Sodium 133 (*)    BUN 24 (*)    Creatinine, Ser 2.19 (*)    GFR calc non Af Amer 29 (*)    GFR calc Af Amer 34 (*)    All other components within normal limits  I-STAT CHEM 8, ED - Abnormal; Notable for the following components:   Sodium 134 (*)    Chloride 100 (*)    BUN 25 (*)    Creatinine, Ser 2.60 (*)    Hemoglobin 10.2 (*)    HCT 30.0 (*)    All other components within normal limits    EKG  EKG Interpretation None       Radiology No results found.  Procedures Procedures (including critical care time)  Medications Ordered in ED Medications  phenylephrine (NEO-SYNEPHRINE) 0.5 % nasal solution 1 drop (not administered)  silver nitrate applicators applicator 1 Stick (1 Stick Topical Given by Other 06/04/17 2212)  Initial Impression / Assessment and Plan / ED Course  I have reviewed the triage vital signs and the nursing notes.  Pertinent labs & imaging results that were available during my care of the patient were reviewed by me and considered in my medical decision making (see chart for details).     Patient presents to ED for evaluation of L sided epistaxis that has been intermittent since yesterday. Brief improvement with Afrin and pressure. He is overall nontoxic appearing and in no acute distress. He is currently on Plavix.  Hemoglobin stable at 10.5.  There is an area of puncture bleeding noted on the septal wall on the left nare as well as a clot noticed in the posterior oropharynx.  Bleeding controlled with silver nitrate and packing.  Advised to return in 2 days to ED, PCP or ENT follow-up for removal.  Patient appears stable for discharge at this time.  Strict return precautions given.  Final Clinical Impressions(s) / ED Diagnoses   Final diagnoses:  Left-sided epistaxis    ED Discharge Orders    None       Delia Heady, PA-C 06/04/17 2352    Charlesetta Shanks, MD 06/06/17 [phone]

## 2017-06-04 NOTE — ED Notes (Addendum)
Patient refusing vital signs at this time.

## 2017-06-04 NOTE — Discharge Instructions (Addendum)
Please read attached information regarding your condition. Continue your home medications as previously prescribed. Follow up with your primary care provider for further evaluation. Your nasal packing can be removed in 2 days.  You can follow-up here in the ED, your primary care provider or the ENT specialist listed below for removal and further evaluation. Return to ED for worsening symptoms, lightheadedness, loss of consciousness.

## 2017-06-06 NOTE — ED Provider Notes (Signed)
Medical screening examination/treatment/procedure(s) were conducted as a shared visit with non-physician practitioner(s) and myself.  I personally evaluated the patient during the encounter.   EKG Interpretation None     .Epistaxis Management Date/Time: 06/06/2017 6:46 PM Performed by: Charlesetta Shanks, MD Authorized by: Charlesetta Shanks, MD   Consent:    Consent obtained:  Verbal   Consent given by:  Patient   Risks discussed:  Bleeding and pain   Alternatives discussed:  No treatment Anesthesia (see MAR for exact dosages):    Anesthesia method:  None Procedure details:    Treatment site:  L anterior   Treatment method:  Anterior pack, silver nitrate and nasal tampon   Treatment complexity:  Extensive   Treatment episode: initial   Post-procedure details:    Assessment:  Bleeding stopped   Patient tolerance of procedure:  Tolerated well, no immediate complications Comments:     Silver nitrate applied to left septum on area with suspicious cluster of vessels but without active bleeding.  2 expandable nasal tampons inserted into the nose without difficulty.  One 2 inch tampon and 1 1 inch tampon then saturated with Neo-Synephrine.  Good positioning of nasal tampons and no bleeding.   Had several episodes of recurrent bleeding.  He denied that he had "gushing bleeding".  Examination showed an area on the left nasal septum that was eroded and likely source of bleeding.  Alert in no acute distress.  Packing applied by myself as outlined above.  I agree with plan of management.   Charlesetta Shanks, MD 06/06/17 [phone]

## 2017-06-07 DIAGNOSIS — Z79899 Other long term (current) drug therapy: Secondary | ICD-10-CM | POA: Diagnosis not present

## 2017-06-07 DIAGNOSIS — D469 Myelodysplastic syndrome, unspecified: Secondary | ICD-10-CM | POA: Diagnosis not present

## 2017-06-07 DIAGNOSIS — Z5111 Encounter for antineoplastic chemotherapy: Secondary | ICD-10-CM | POA: Diagnosis not present

## 2017-06-07 LAB — PATHOLOGIST SMEAR REVIEW

## 2017-06-08 DIAGNOSIS — Z79899 Other long term (current) drug therapy: Secondary | ICD-10-CM | POA: Diagnosis not present

## 2017-06-08 DIAGNOSIS — D469 Myelodysplastic syndrome, unspecified: Secondary | ICD-10-CM | POA: Diagnosis not present

## 2017-06-08 DIAGNOSIS — Z5111 Encounter for antineoplastic chemotherapy: Secondary | ICD-10-CM | POA: Diagnosis not present

## 2017-06-09 DIAGNOSIS — R040 Epistaxis: Secondary | ICD-10-CM | POA: Diagnosis not present

## 2017-06-15 DIAGNOSIS — D469 Myelodysplastic syndrome, unspecified: Secondary | ICD-10-CM | POA: Diagnosis not present

## 2017-06-22 DIAGNOSIS — Z8673 Personal history of transient ischemic attack (TIA), and cerebral infarction without residual deficits: Secondary | ICD-10-CM | POA: Diagnosis not present

## 2017-06-22 DIAGNOSIS — Z7982 Long term (current) use of aspirin: Secondary | ICD-10-CM | POA: Diagnosis not present

## 2017-06-22 DIAGNOSIS — R634 Abnormal weight loss: Secondary | ICD-10-CM | POA: Diagnosis not present

## 2017-06-22 DIAGNOSIS — E785 Hyperlipidemia, unspecified: Secondary | ICD-10-CM | POA: Diagnosis not present

## 2017-06-22 DIAGNOSIS — D469 Myelodysplastic syndrome, unspecified: Secondary | ICD-10-CM | POA: Diagnosis not present

## 2017-06-22 DIAGNOSIS — Z87891 Personal history of nicotine dependence: Secondary | ICD-10-CM | POA: Diagnosis not present

## 2017-06-22 DIAGNOSIS — Z7902 Long term (current) use of antithrombotics/antiplatelets: Secondary | ICD-10-CM | POA: Diagnosis not present

## 2017-06-22 DIAGNOSIS — R05 Cough: Secondary | ICD-10-CM | POA: Diagnosis not present

## 2017-06-24 ENCOUNTER — Encounter: Payer: Self-pay | Admitting: Vascular Surgery

## 2017-06-24 ENCOUNTER — Ambulatory Visit (INDEPENDENT_AMBULATORY_CARE_PROVIDER_SITE_OTHER): Payer: Medicare Other | Admitting: Vascular Surgery

## 2017-06-24 VITALS — BP 105/62 | HR 92 | Temp 98.4°F | Resp 20 | Ht 64.0 in | Wt 126.0 lb

## 2017-06-24 DIAGNOSIS — I779 Disorder of arteries and arterioles, unspecified: Secondary | ICD-10-CM | POA: Diagnosis not present

## 2017-06-24 DIAGNOSIS — I6521 Occlusion and stenosis of right carotid artery: Secondary | ICD-10-CM | POA: Diagnosis not present

## 2017-06-24 NOTE — Progress Notes (Signed)
Patient is a 66 year old male who previously was evaluated for possible right carotid stenting in 2017.  At that time he had a CT angios which suggested greater than 80% right internal carotid artery stenosis.  Subsequent carotid angiogram showed only a 50% stenosis.  He also had a previous carotid angiogram by Dr. Doren Custard in 2015 which also did not correlate with duplex findings.  These were greatly overestimated.  The stenosis at that point was thought to be 70%.  He remains asymptomatic.  He denies any symptoms of TIA amaurosis or stroke in the last year.  He is on aspirin and a statin.  He is currently undergoing chemotherapy for an active leukemia.  He has previously had a head and neck cancer on the right side and underwent some type of neck dissection and radiation therapy.  Review of systems: He denies shortness of breath.  He denies chest pain.  He does have some fatigue from his chemotherapy.  He states that apparently he will be on chemotherapy for life because every time he stops the chemotherapy the cancer recurs.  Current Outpatient Medications on File Prior to Visit  Medication Sig Dispense Refill  . allopurinol (ZYLOPRIM) 100 MG tablet Take 200 mg by mouth.    Marland Kitchen aspirin EC 325 MG tablet Take 325 mg by mouth daily.    Marland Kitchen atorvastatin (LIPITOR) 40 MG tablet Take 20 mg by mouth at bedtime.    . clopidogrel (PLAVIX) 75 MG tablet Take 1 tablet (75 mg total) by mouth daily. 30 tablet 1  . hydroxyurea (HYDREA) 500 MG capsule Take 1,000 mg by mouth daily.     Marland Kitchen loratadine (CLARITIN) 10 MG tablet Take 10 mg by mouth at bedtime.      No current facility-administered medications on file prior to visit.     No Known Allergies Past Surgical History:  Procedure Laterality Date  . ARCH AORTOGRAM N/A 03/19/2014   Procedure: ARCH AORTOGRAM;  Surgeon: Angelia Mould, MD;  Location: [facility] CATH LAB;  Service: Cardiovascular;  Laterality: N/A;  . CEREBRAL ANGIOGRAM N/A 03/19/2014   Procedure:  CEREBRAL ANGIOGRAM;  Surgeon: Angelia Mould, MD;  Location: [facility] CATH LAB;  Service: Cardiovascular;  Laterality: N/A;  . CHOLECYSTECTOMY    . LYMPHADENECTOMY Right 2009   neck  . PERIPHERAL VASCULAR CATHETERIZATION Right 04/17/2016   Procedure: Carotid PTA/Stent Intervention;  Surgeon: Elam Dutch, MD;  Location: [place] CV LAB;  Service: Cardiovascular;  Laterality: Right;  . PR VEIN BYPASS GRAFT,AORTO-FEM-POP     Past Medical History:  Diagnosis Date  . Cancer (HCC)    throat, lymph node  . Carotid artery occlusion   . GERD (gastroesophageal reflux disease)   . Hyperlipemia   . PVD (peripheral vascular disease) ([place])   . Stroke [facility])     Physical exam:  Vitals:   06/24/17 0921  BP: 105/62  Pulse: 92  Resp: 20  Temp: 98.4 F (36.9 C)  TempSrc: Oral  SpO2: 100%  Weight: 126 lb (57.2 kg)  Height: 5\' 4"  (1.626 m)    Neck: Right side high-pitched carotid bruit no left carotid bruit, right neck changes consistent with radiation with thinned out skin and thickening  Chest: Clear to auscultation bilaterally.    Cardiac: Regular rate and rhythm without murmur  Extremities: 1+ left radial pulse 2+ right radial pulse Neuro: Symmetric upper extremity lower extremity motor strength 5/5 no facial asymmetry  Data: I reviewed the patient's recent carotid duplex exam dated November 21st 2018.  This showed velocities on the right side of 310/139.  This is compared to 284/100 in April 2018.  Velocities were 314/116 September 2017  Assessment: Patient with at least moderate right internal carotid artery stenosis.  Currently asymptomatic on aspirin and statin.  Prior noninvasive exams have not correlated with arteriography.  The patient has had 2 prior arteriograms which suggested much less level of stenosis.  This was with increased velocities similar to the most recent exam.  The patient is not a very good candidate overall for carotid endarterectomy due to prior  radiation.  Additionally, he is asymptomatic and currently undergoing chemotherapy for an active leukemia.  Plan: The best option at this point would repeat his CT angiogram of the head and neck.  Based on the findings of this we would consider whether or not to pursue arteriography or continue medical management.  Ruta Hinds, MD [facility] [place] Office: [phone] Pager: [phone]

## 2017-06-24 NOTE — Addendum Note (Signed)
Addended by: Lianne Cure A on: 06/24/2017 10:14 AM   Modules accepted: Orders

## 2017-06-30 DIAGNOSIS — Z5111 Encounter for antineoplastic chemotherapy: Secondary | ICD-10-CM | POA: Diagnosis not present

## 2017-06-30 DIAGNOSIS — Z79899 Other long term (current) drug therapy: Secondary | ICD-10-CM | POA: Diagnosis not present

## 2017-06-30 DIAGNOSIS — D469 Myelodysplastic syndrome, unspecified: Secondary | ICD-10-CM | POA: Diagnosis not present

## 2017-07-01 DIAGNOSIS — Z79899 Other long term (current) drug therapy: Secondary | ICD-10-CM | POA: Diagnosis not present

## 2017-07-01 DIAGNOSIS — D469 Myelodysplastic syndrome, unspecified: Secondary | ICD-10-CM | POA: Diagnosis not present

## 2017-07-01 DIAGNOSIS — Z5111 Encounter for antineoplastic chemotherapy: Secondary | ICD-10-CM | POA: Diagnosis not present

## 2017-07-02 DIAGNOSIS — Z5111 Encounter for antineoplastic chemotherapy: Secondary | ICD-10-CM | POA: Diagnosis not present

## 2017-07-02 DIAGNOSIS — D469 Myelodysplastic syndrome, unspecified: Secondary | ICD-10-CM | POA: Diagnosis not present

## 2017-07-02 DIAGNOSIS — Z79899 Other long term (current) drug therapy: Secondary | ICD-10-CM | POA: Diagnosis not present

## 2017-07-05 DIAGNOSIS — Z5111 Encounter for antineoplastic chemotherapy: Secondary | ICD-10-CM | POA: Diagnosis not present

## 2017-07-05 DIAGNOSIS — D469 Myelodysplastic syndrome, unspecified: Secondary | ICD-10-CM | POA: Diagnosis not present

## 2017-07-05 DIAGNOSIS — Z79899 Other long term (current) drug therapy: Secondary | ICD-10-CM | POA: Diagnosis not present

## 2017-07-07 DIAGNOSIS — Z79899 Other long term (current) drug therapy: Secondary | ICD-10-CM | POA: Diagnosis not present

## 2017-07-07 DIAGNOSIS — D469 Myelodysplastic syndrome, unspecified: Secondary | ICD-10-CM | POA: Diagnosis not present

## 2017-07-07 DIAGNOSIS — Z5111 Encounter for antineoplastic chemotherapy: Secondary | ICD-10-CM | POA: Diagnosis not present

## 2017-07-08 DIAGNOSIS — Z79899 Other long term (current) drug therapy: Secondary | ICD-10-CM | POA: Diagnosis not present

## 2017-07-08 DIAGNOSIS — Z5111 Encounter for antineoplastic chemotherapy: Secondary | ICD-10-CM | POA: Diagnosis not present

## 2017-07-08 DIAGNOSIS — D469 Myelodysplastic syndrome, unspecified: Secondary | ICD-10-CM | POA: Diagnosis not present

## 2017-07-09 DIAGNOSIS — D469 Myelodysplastic syndrome, unspecified: Secondary | ICD-10-CM | POA: Diagnosis not present

## 2017-07-09 DIAGNOSIS — Z5111 Encounter for antineoplastic chemotherapy: Secondary | ICD-10-CM | POA: Diagnosis not present

## 2017-07-09 DIAGNOSIS — Z79899 Other long term (current) drug therapy: Secondary | ICD-10-CM | POA: Diagnosis not present

## 2017-07-13 DIAGNOSIS — D469 Myelodysplastic syndrome, unspecified: Secondary | ICD-10-CM | POA: Diagnosis not present

## 2017-07-14 DIAGNOSIS — D469 Myelodysplastic syndrome, unspecified: Secondary | ICD-10-CM | POA: Diagnosis not present

## 2017-07-19 ENCOUNTER — Other Ambulatory Visit: Payer: Self-pay | Admitting: Vascular Surgery

## 2017-07-19 ENCOUNTER — Ambulatory Visit
Admission: RE | Admit: 2017-07-19 | Discharge: 2017-07-19 | Disposition: A | Discharge status: Home / Self Care | Payer: Medicare Other | Source: Ambulatory Visit | Attending: Vascular Surgery | Admitting: Vascular Surgery

## 2017-07-19 DIAGNOSIS — I6521 Occlusion and stenosis of right carotid artery: Secondary | ICD-10-CM

## 2017-07-20 DIAGNOSIS — D469 Myelodysplastic syndrome, unspecified: Secondary | ICD-10-CM | POA: Diagnosis not present

## 2017-07-20 DIAGNOSIS — R634 Abnormal weight loss: Secondary | ICD-10-CM | POA: Diagnosis not present

## 2017-07-20 DIAGNOSIS — Z85828 Personal history of other malignant neoplasm of skin: Secondary | ICD-10-CM | POA: Diagnosis not present

## 2017-07-20 DIAGNOSIS — Z6821 Body mass index (BMI) 21.0-21.9, adult: Secondary | ICD-10-CM | POA: Diagnosis not present

## 2017-07-20 DIAGNOSIS — Z8673 Personal history of transient ischemic attack (TIA), and cerebral infarction without residual deficits: Secondary | ICD-10-CM | POA: Diagnosis not present

## 2017-07-20 DIAGNOSIS — Z7902 Long term (current) use of antithrombotics/antiplatelets: Secondary | ICD-10-CM | POA: Diagnosis not present

## 2017-07-20 DIAGNOSIS — C9500 Acute leukemia of unspecified cell type not having achieved remission: Secondary | ICD-10-CM | POA: Diagnosis not present

## 2017-07-20 DIAGNOSIS — E785 Hyperlipidemia, unspecified: Secondary | ICD-10-CM | POA: Diagnosis not present

## 2017-07-20 DIAGNOSIS — N289 Disorder of kidney and ureter, unspecified: Secondary | ICD-10-CM | POA: Diagnosis not present

## 2017-07-20 DIAGNOSIS — Z923 Personal history of irradiation: Secondary | ICD-10-CM | POA: Diagnosis not present

## 2017-07-20 DIAGNOSIS — R05 Cough: Secondary | ICD-10-CM | POA: Diagnosis not present

## 2017-07-20 DIAGNOSIS — Z9221 Personal history of antineoplastic chemotherapy: Secondary | ICD-10-CM | POA: Diagnosis not present

## 2017-07-20 DIAGNOSIS — Z7982 Long term (current) use of aspirin: Secondary | ICD-10-CM | POA: Diagnosis not present

## 2017-07-22 ENCOUNTER — Ambulatory Visit (INDEPENDENT_AMBULATORY_CARE_PROVIDER_SITE_OTHER): Payer: Medicare Other | Admitting: Vascular Surgery

## 2017-07-22 ENCOUNTER — Encounter: Payer: Self-pay | Admitting: Vascular Surgery

## 2017-07-22 VITALS — BP 98/58 | HR 88 | Temp 97.7°F | Resp 20 | Ht 64.0 in | Wt 119.9 lb

## 2017-07-22 DIAGNOSIS — I739 Peripheral vascular disease, unspecified: Secondary | ICD-10-CM

## 2017-07-22 DIAGNOSIS — N184 Chronic kidney disease, stage 4 (severe): Secondary | ICD-10-CM

## 2017-07-22 DIAGNOSIS — I6523 Occlusion and stenosis of bilateral carotid arteries: Secondary | ICD-10-CM | POA: Diagnosis not present

## 2017-07-22 NOTE — Progress Notes (Signed)
Patient is a 67 year old male that we have been following for an asymptomatic carotid stenosis.  There has been discrepancy between his carotid angiograms and carotid duplex scans which has made following progression of stenosis difficult.  However, the patient has never had any symptoms of TIA amaurosis or stroke.  His duplex findings have consistently overestimated level of stenosis compared to angiogram.  He was last seen a few weeks ago by me with a duplex which suggested that the right internal carotid stenosis was greater than 70%.  He has a moderate left internal carotid artery stenosis as well actually 60%.  He was sent for a CT angiogram of the neck for further clarification of this.  However, the study could not be done due to the patient's creatinine being elevated greater than 4 and he could not receive contrast.  He did receive a head CT which showed no evidence of stroke bleed or malignancy.  He continues to deny any symptoms of TIA amaurosis or stroke.  He is on aspirin and a statin.  He currently is undergoing chemotherapy Monday Wednesday Friday for what currently is an indefinite period.  He is followed by Dr. Florene Glen at [facility] for this.  Past Surgical History:  Procedure Laterality Date  . ARCH AORTOGRAM N/A 03/19/2014   Procedure: ARCH AORTOGRAM;  Surgeon: Angelia Mould, MD;  Location: [facility] CATH LAB;  Service: Cardiovascular;  Laterality: N/A;  . CEREBRAL ANGIOGRAM N/A 03/19/2014   Procedure: CEREBRAL ANGIOGRAM;  Surgeon: Angelia Mould, MD;  Location: [facility] CATH LAB;  Service: Cardiovascular;  Laterality: N/A;  . CHOLECYSTECTOMY    . LYMPHADENECTOMY Right 2009   neck  . PERIPHERAL VASCULAR CATHETERIZATION Right 04/17/2016   Procedure: Carotid PTA/Stent Intervention;  Surgeon: Elam Dutch, MD;  Location: [place] CV LAB;  Service: Cardiovascular;  Laterality: Right;  . PR VEIN BYPASS GRAFT,AORTO-FEM-POP       Physical exam:  Vitals:   07/22/17  0832 07/22/17 0834  BP: 98/62 (!) 98/58  Pulse: 88   Resp: 20   Temp: 97.7 F (36.5 C)   TempSrc: Oral   SpO2: 100%   Weight: 119 lb 14.4 oz (54.4 kg)   Height: 5\' 4"  (1.626 m)     Neck: No carotid bruits  HEENT: Temporal wasting  Extremities: 2+ radial pulses bilaterally  Data: I reviewed the images of the patient's head CT from earlier this week.  Shows no evidence of mass intracranial bleed or stroke.  Assessment: Patient with asymptomatic at least moderate bilateral carotid stenosis right greater than left.  He currently has other pressing issues with ongoing acute leukemia undergoing chemotherapy.  He also has deteriorating renal function.  I believe at this point as long as his carotid stenosis remains asymptomatic we should not consider treatment of this.  If he develops further symptoms we would consider further investigation of this and potentially repeat his carotid angiogram although this is going to be difficult considering his current state of renal dysfunction.  Plan: The patient will return for follow-up with a duplex scan of his lower extremity bypass graft as well as a carotid duplex scan and see our nurse practitioner in May 2019.  He will return sooner if he develops symptoms related to his carotid stenosis consistent with TIA amaurosis or stroke.  He will continue his aspirin and statin.  I will leave further workup of his renal dysfunction to his leukemia Dr. at [facility].  A note will be sent to him  today.  Ruta Hinds, MD [facility] Office: [phone] Pager: [phone]

## 2017-07-27 DIAGNOSIS — Z5111 Encounter for antineoplastic chemotherapy: Secondary | ICD-10-CM | POA: Diagnosis not present

## 2017-07-27 DIAGNOSIS — D469 Myelodysplastic syndrome, unspecified: Secondary | ICD-10-CM | POA: Diagnosis not present

## 2017-07-27 DIAGNOSIS — Z79899 Other long term (current) drug therapy: Secondary | ICD-10-CM | POA: Diagnosis not present

## 2017-07-28 DIAGNOSIS — Z5111 Encounter for antineoplastic chemotherapy: Secondary | ICD-10-CM | POA: Diagnosis not present

## 2017-07-28 DIAGNOSIS — D469 Myelodysplastic syndrome, unspecified: Secondary | ICD-10-CM | POA: Diagnosis not present

## 2017-07-28 DIAGNOSIS — Z79899 Other long term (current) drug therapy: Secondary | ICD-10-CM | POA: Diagnosis not present

## 2017-07-29 DIAGNOSIS — Z79899 Other long term (current) drug therapy: Secondary | ICD-10-CM | POA: Diagnosis not present

## 2017-07-29 DIAGNOSIS — D469 Myelodysplastic syndrome, unspecified: Secondary | ICD-10-CM | POA: Diagnosis not present

## 2017-07-29 DIAGNOSIS — Z5111 Encounter for antineoplastic chemotherapy: Secondary | ICD-10-CM | POA: Diagnosis not present

## 2017-07-30 DIAGNOSIS — Z79899 Other long term (current) drug therapy: Secondary | ICD-10-CM | POA: Diagnosis not present

## 2017-07-30 DIAGNOSIS — Z5111 Encounter for antineoplastic chemotherapy: Secondary | ICD-10-CM | POA: Diagnosis not present

## 2017-07-30 DIAGNOSIS — D469 Myelodysplastic syndrome, unspecified: Secondary | ICD-10-CM | POA: Diagnosis not present

## 2017-08-02 DIAGNOSIS — D469 Myelodysplastic syndrome, unspecified: Secondary | ICD-10-CM | POA: Diagnosis not present

## 2017-08-02 DIAGNOSIS — Z5111 Encounter for antineoplastic chemotherapy: Secondary | ICD-10-CM | POA: Diagnosis not present

## 2017-08-02 DIAGNOSIS — Z79899 Other long term (current) drug therapy: Secondary | ICD-10-CM | POA: Diagnosis not present

## 2017-08-03 DIAGNOSIS — Z79899 Other long term (current) drug therapy: Secondary | ICD-10-CM | POA: Diagnosis not present

## 2017-08-03 DIAGNOSIS — D469 Myelodysplastic syndrome, unspecified: Secondary | ICD-10-CM | POA: Diagnosis not present

## 2017-08-03 DIAGNOSIS — Z5111 Encounter for antineoplastic chemotherapy: Secondary | ICD-10-CM | POA: Diagnosis not present

## 2017-08-04 DIAGNOSIS — D469 Myelodysplastic syndrome, unspecified: Secondary | ICD-10-CM | POA: Diagnosis not present

## 2017-08-04 DIAGNOSIS — Z5111 Encounter for antineoplastic chemotherapy: Secondary | ICD-10-CM | POA: Diagnosis not present

## 2017-08-04 DIAGNOSIS — Z79899 Other long term (current) drug therapy: Secondary | ICD-10-CM | POA: Diagnosis not present

## 2017-08-10 DIAGNOSIS — D469 Myelodysplastic syndrome, unspecified: Secondary | ICD-10-CM | POA: Diagnosis not present

## 2017-08-17 DIAGNOSIS — D469 Myelodysplastic syndrome, unspecified: Secondary | ICD-10-CM | POA: Diagnosis not present

## 2017-08-17 DIAGNOSIS — N179 Acute kidney failure, unspecified: Secondary | ICD-10-CM | POA: Diagnosis not present

## 2017-08-17 DIAGNOSIS — Z8673 Personal history of transient ischemic attack (TIA), and cerebral infarction without residual deficits: Secondary | ICD-10-CM | POA: Diagnosis not present

## 2017-08-17 DIAGNOSIS — Z9221 Personal history of antineoplastic chemotherapy: Secondary | ICD-10-CM | POA: Diagnosis not present

## 2017-08-23 DIAGNOSIS — Z79899 Other long term (current) drug therapy: Secondary | ICD-10-CM | POA: Diagnosis not present

## 2017-08-23 DIAGNOSIS — Z5111 Encounter for antineoplastic chemotherapy: Secondary | ICD-10-CM | POA: Diagnosis not present

## 2017-08-23 DIAGNOSIS — D469 Myelodysplastic syndrome, unspecified: Secondary | ICD-10-CM | POA: Diagnosis not present

## 2017-08-24 DIAGNOSIS — D469 Myelodysplastic syndrome, unspecified: Secondary | ICD-10-CM | POA: Diagnosis not present

## 2017-08-24 DIAGNOSIS — Z5111 Encounter for antineoplastic chemotherapy: Secondary | ICD-10-CM | POA: Diagnosis not present

## 2017-08-24 DIAGNOSIS — Z79899 Other long term (current) drug therapy: Secondary | ICD-10-CM | POA: Diagnosis not present

## 2017-08-25 DIAGNOSIS — D469 Myelodysplastic syndrome, unspecified: Secondary | ICD-10-CM | POA: Diagnosis not present

## 2017-08-26 DIAGNOSIS — D469 Myelodysplastic syndrome, unspecified: Secondary | ICD-10-CM | POA: Diagnosis not present

## 2017-08-26 DIAGNOSIS — Z5111 Encounter for antineoplastic chemotherapy: Secondary | ICD-10-CM | POA: Diagnosis not present

## 2017-08-26 DIAGNOSIS — Z79899 Other long term (current) drug therapy: Secondary | ICD-10-CM | POA: Diagnosis not present

## 2017-08-27 DIAGNOSIS — Z79899 Other long term (current) drug therapy: Secondary | ICD-10-CM | POA: Diagnosis not present

## 2017-08-27 DIAGNOSIS — D469 Myelodysplastic syndrome, unspecified: Secondary | ICD-10-CM | POA: Diagnosis not present

## 2017-08-27 DIAGNOSIS — Z5111 Encounter for antineoplastic chemotherapy: Secondary | ICD-10-CM | POA: Diagnosis not present

## 2017-08-30 DIAGNOSIS — Z5111 Encounter for antineoplastic chemotherapy: Secondary | ICD-10-CM | POA: Diagnosis not present

## 2017-08-30 DIAGNOSIS — Z79899 Other long term (current) drug therapy: Secondary | ICD-10-CM | POA: Diagnosis not present

## 2017-08-30 DIAGNOSIS — D469 Myelodysplastic syndrome, unspecified: Secondary | ICD-10-CM | POA: Diagnosis not present

## 2017-08-31 DIAGNOSIS — Z79899 Other long term (current) drug therapy: Secondary | ICD-10-CM | POA: Diagnosis not present

## 2017-08-31 DIAGNOSIS — D469 Myelodysplastic syndrome, unspecified: Secondary | ICD-10-CM | POA: Diagnosis not present

## 2017-08-31 DIAGNOSIS — Z5111 Encounter for antineoplastic chemotherapy: Secondary | ICD-10-CM | POA: Diagnosis not present

## 2017-09-06 DIAGNOSIS — D469 Myelodysplastic syndrome, unspecified: Secondary | ICD-10-CM | POA: Diagnosis not present

## 2017-09-14 DIAGNOSIS — Z7901 Long term (current) use of anticoagulants: Secondary | ICD-10-CM | POA: Diagnosis not present

## 2017-09-14 DIAGNOSIS — R4781 Slurred speech: Secondary | ICD-10-CM | POA: Diagnosis not present

## 2017-09-14 DIAGNOSIS — D649 Anemia, unspecified: Secondary | ICD-10-CM | POA: Diagnosis not present

## 2017-09-14 DIAGNOSIS — D469 Myelodysplastic syndrome, unspecified: Secondary | ICD-10-CM | POA: Diagnosis not present

## 2017-09-14 DIAGNOSIS — Q928 Other specified trisomies and partial trisomies of autosomes: Secondary | ICD-10-CM | POA: Diagnosis not present

## 2017-09-14 DIAGNOSIS — D696 Thrombocytopenia, unspecified: Secondary | ICD-10-CM | POA: Diagnosis not present

## 2017-09-14 DIAGNOSIS — M542 Cervicalgia: Secondary | ICD-10-CM | POA: Diagnosis not present

## 2017-09-14 DIAGNOSIS — Z8589 Personal history of malignant neoplasm of other organs and systems: Secondary | ICD-10-CM | POA: Diagnosis not present

## 2017-09-14 DIAGNOSIS — Z87891 Personal history of nicotine dependence: Secondary | ICD-10-CM | POA: Diagnosis not present

## 2017-09-14 DIAGNOSIS — E883 Tumor lysis syndrome: Secondary | ICD-10-CM | POA: Diagnosis not present

## 2017-09-14 DIAGNOSIS — Z923 Personal history of irradiation: Secondary | ICD-10-CM | POA: Diagnosis not present

## 2017-09-14 DIAGNOSIS — R070 Pain in throat: Secondary | ICD-10-CM | POA: Diagnosis not present

## 2017-09-14 DIAGNOSIS — R040 Epistaxis: Secondary | ICD-10-CM | POA: Diagnosis not present

## 2017-09-14 DIAGNOSIS — R634 Abnormal weight loss: Secondary | ICD-10-CM | POA: Diagnosis not present

## 2017-09-14 DIAGNOSIS — Z9221 Personal history of antineoplastic chemotherapy: Secondary | ICD-10-CM | POA: Diagnosis not present

## 2017-09-14 DIAGNOSIS — Z8673 Personal history of transient ischemic attack (TIA), and cerebral infarction without residual deficits: Secondary | ICD-10-CM | POA: Diagnosis not present

## 2017-09-17 DIAGNOSIS — R490 Dysphonia: Secondary | ICD-10-CM | POA: Diagnosis not present

## 2017-09-17 DIAGNOSIS — R040 Epistaxis: Secondary | ICD-10-CM | POA: Diagnosis not present

## 2017-09-20 DIAGNOSIS — Z79899 Other long term (current) drug therapy: Secondary | ICD-10-CM | POA: Diagnosis not present

## 2017-09-20 DIAGNOSIS — D469 Myelodysplastic syndrome, unspecified: Secondary | ICD-10-CM | POA: Diagnosis not present

## 2017-09-20 DIAGNOSIS — Z5111 Encounter for antineoplastic chemotherapy: Secondary | ICD-10-CM | POA: Diagnosis not present

## 2017-09-21 DIAGNOSIS — Z5111 Encounter for antineoplastic chemotherapy: Secondary | ICD-10-CM | POA: Diagnosis not present

## 2017-09-21 DIAGNOSIS — Z79899 Other long term (current) drug therapy: Secondary | ICD-10-CM | POA: Diagnosis not present

## 2017-09-21 DIAGNOSIS — D469 Myelodysplastic syndrome, unspecified: Secondary | ICD-10-CM | POA: Diagnosis not present

## 2017-09-22 DIAGNOSIS — Z79899 Other long term (current) drug therapy: Secondary | ICD-10-CM | POA: Diagnosis not present

## 2017-09-22 DIAGNOSIS — D469 Myelodysplastic syndrome, unspecified: Secondary | ICD-10-CM | POA: Diagnosis not present

## 2017-09-22 DIAGNOSIS — Z5111 Encounter for antineoplastic chemotherapy: Secondary | ICD-10-CM | POA: Diagnosis not present

## 2017-09-23 DIAGNOSIS — D469 Myelodysplastic syndrome, unspecified: Secondary | ICD-10-CM | POA: Diagnosis not present

## 2017-09-24 DIAGNOSIS — D469 Myelodysplastic syndrome, unspecified: Secondary | ICD-10-CM | POA: Diagnosis not present

## 2017-09-27 DIAGNOSIS — Z5111 Encounter for antineoplastic chemotherapy: Secondary | ICD-10-CM | POA: Diagnosis not present

## 2017-09-27 DIAGNOSIS — D469 Myelodysplastic syndrome, unspecified: Secondary | ICD-10-CM | POA: Diagnosis not present

## 2017-09-27 DIAGNOSIS — Z79899 Other long term (current) drug therapy: Secondary | ICD-10-CM | POA: Diagnosis not present

## 2017-09-28 DIAGNOSIS — Z79899 Other long term (current) drug therapy: Secondary | ICD-10-CM | POA: Diagnosis not present

## 2017-09-28 DIAGNOSIS — Z5111 Encounter for antineoplastic chemotherapy: Secondary | ICD-10-CM | POA: Diagnosis not present

## 2017-09-28 DIAGNOSIS — D469 Myelodysplastic syndrome, unspecified: Secondary | ICD-10-CM | POA: Diagnosis not present

## 2017-10-13 ENCOUNTER — Emergency Department (HOSPITAL_BASED_OUTPATIENT_CLINIC_OR_DEPARTMENT_OTHER)
Admission: EM | Admit: 2017-10-13 | Discharge: 2017-10-13 | Disposition: A | Discharge status: Home / Self Care | Payer: Medicare Other | Attending: Emergency Medicine | Admitting: Emergency Medicine

## 2017-10-13 ENCOUNTER — Emergency Department (HOSPITAL_BASED_OUTPATIENT_CLINIC_OR_DEPARTMENT_OTHER): Payer: Medicare Other

## 2017-10-13 ENCOUNTER — Encounter (HOSPITAL_BASED_OUTPATIENT_CLINIC_OR_DEPARTMENT_OTHER): Payer: Self-pay

## 2017-10-13 ENCOUNTER — Other Ambulatory Visit: Payer: Self-pay

## 2017-10-13 DIAGNOSIS — I071 Rheumatic tricuspid insufficiency: Secondary | ICD-10-CM | POA: Diagnosis not present

## 2017-10-13 DIAGNOSIS — D72829 Elevated white blood cell count, unspecified: Secondary | ICD-10-CM | POA: Diagnosis not present

## 2017-10-13 DIAGNOSIS — R0603 Acute respiratory distress: Secondary | ICD-10-CM | POA: Diagnosis not present

## 2017-10-13 DIAGNOSIS — Z4682 Encounter for fitting and adjustment of non-vascular catheter: Secondary | ICD-10-CM | POA: Diagnosis not present

## 2017-10-13 DIAGNOSIS — C760 Malignant neoplasm of head, face and neck: Secondary | ICD-10-CM | POA: Diagnosis not present

## 2017-10-13 DIAGNOSIS — J9600 Acute respiratory failure, unspecified whether with hypoxia or hypercapnia: Secondary | ICD-10-CM | POA: Diagnosis not present

## 2017-10-13 DIAGNOSIS — Z8249 Family history of ischemic heart disease and other diseases of the circulatory system: Secondary | ICD-10-CM | POA: Diagnosis not present

## 2017-10-13 DIAGNOSIS — Z7982 Long term (current) use of aspirin: Secondary | ICD-10-CM | POA: Insufficient documentation

## 2017-10-13 DIAGNOSIS — R633 Feeding difficulties: Secondary | ICD-10-CM | POA: Diagnosis not present

## 2017-10-13 DIAGNOSIS — R627 Adult failure to thrive: Secondary | ICD-10-CM | POA: Diagnosis present

## 2017-10-13 DIAGNOSIS — I679 Cerebrovascular disease, unspecified: Secondary | ICD-10-CM | POA: Diagnosis not present

## 2017-10-13 DIAGNOSIS — R070 Pain in throat: Secondary | ICD-10-CM | POA: Diagnosis not present

## 2017-10-13 DIAGNOSIS — I2510 Atherosclerotic heart disease of native coronary artery without angina pectoris: Secondary | ICD-10-CM | POA: Diagnosis not present

## 2017-10-13 DIAGNOSIS — R791 Abnormal coagulation profile: Secondary | ICD-10-CM | POA: Diagnosis not present

## 2017-10-13 DIAGNOSIS — B9562 Methicillin resistant Staphylococcus aureus infection as the cause of diseases classified elsewhere: Secondary | ICD-10-CM | POA: Diagnosis not present

## 2017-10-13 DIAGNOSIS — R0602 Shortness of breath: Secondary | ICD-10-CM

## 2017-10-13 DIAGNOSIS — D630 Anemia in neoplastic disease: Secondary | ICD-10-CM | POA: Diagnosis not present

## 2017-10-13 DIAGNOSIS — K219 Gastro-esophageal reflux disease without esophagitis: Secondary | ICD-10-CM | POA: Diagnosis present

## 2017-10-13 DIAGNOSIS — R601 Generalized edema: Secondary | ICD-10-CM | POA: Diagnosis not present

## 2017-10-13 DIAGNOSIS — Z801 Family history of malignant neoplasm of trachea, bronchus and lung: Secondary | ICD-10-CM | POA: Diagnosis not present

## 2017-10-13 DIAGNOSIS — R490 Dysphonia: Secondary | ICD-10-CM | POA: Diagnosis present

## 2017-10-13 DIAGNOSIS — Z681 Body mass index (BMI) 19 or less, adult: Secondary | ICD-10-CM | POA: Diagnosis not present

## 2017-10-13 DIAGNOSIS — J189 Pneumonia, unspecified organism: Secondary | ICD-10-CM | POA: Diagnosis not present

## 2017-10-13 DIAGNOSIS — D469 Myelodysplastic syndrome, unspecified: Secondary | ICD-10-CM | POA: Diagnosis present

## 2017-10-13 DIAGNOSIS — E46 Unspecified protein-calorie malnutrition: Secondary | ICD-10-CM | POA: Diagnosis not present

## 2017-10-13 DIAGNOSIS — R579 Shock, unspecified: Secondary | ICD-10-CM | POA: Diagnosis not present

## 2017-10-13 DIAGNOSIS — R059 Cough, unspecified: Secondary | ICD-10-CM

## 2017-10-13 DIAGNOSIS — Z8589 Personal history of malignant neoplasm of other organs and systems: Secondary | ICD-10-CM | POA: Diagnosis not present

## 2017-10-13 DIAGNOSIS — A419 Sepsis, unspecified organism: Secondary | ICD-10-CM | POA: Diagnosis not present

## 2017-10-13 DIAGNOSIS — R05 Cough: Secondary | ICD-10-CM

## 2017-10-13 DIAGNOSIS — D689 Coagulation defect, unspecified: Secondary | ICD-10-CM | POA: Diagnosis not present

## 2017-10-13 DIAGNOSIS — R4781 Slurred speech: Secondary | ICD-10-CM | POA: Diagnosis present

## 2017-10-13 DIAGNOSIS — Z515 Encounter for palliative care: Secondary | ICD-10-CM | POA: Diagnosis not present

## 2017-10-13 DIAGNOSIS — R4181 Age-related cognitive decline: Secondary | ICD-10-CM | POA: Diagnosis not present

## 2017-10-13 DIAGNOSIS — Z87891 Personal history of nicotine dependence: Secondary | ICD-10-CM | POA: Insufficient documentation

## 2017-10-13 DIAGNOSIS — Z7902 Long term (current) use of antithrombotics/antiplatelets: Secondary | ICD-10-CM | POA: Diagnosis not present

## 2017-10-13 DIAGNOSIS — E43 Unspecified severe protein-calorie malnutrition: Secondary | ICD-10-CM | POA: Diagnosis present

## 2017-10-13 DIAGNOSIS — I517 Cardiomegaly: Secondary | ICD-10-CM | POA: Diagnosis not present

## 2017-10-13 DIAGNOSIS — R54 Age-related physical debility: Secondary | ICD-10-CM | POA: Diagnosis not present

## 2017-10-13 DIAGNOSIS — I348 Other nonrheumatic mitral valve disorders: Secondary | ICD-10-CM | POA: Diagnosis not present

## 2017-10-13 DIAGNOSIS — I350 Nonrheumatic aortic (valve) stenosis: Secondary | ICD-10-CM | POA: Diagnosis not present

## 2017-10-13 DIAGNOSIS — Z85828 Personal history of other malignant neoplasm of skin: Secondary | ICD-10-CM | POA: Diagnosis not present

## 2017-10-13 DIAGNOSIS — R638 Other symptoms and signs concerning food and fluid intake: Secondary | ICD-10-CM | POA: Diagnosis not present

## 2017-10-13 DIAGNOSIS — Z923 Personal history of irradiation: Secondary | ICD-10-CM | POA: Diagnosis not present

## 2017-10-13 DIAGNOSIS — E785 Hyperlipidemia, unspecified: Secondary | ICD-10-CM | POA: Diagnosis present

## 2017-10-13 DIAGNOSIS — R1310 Dysphagia, unspecified: Secondary | ICD-10-CM | POA: Diagnosis present

## 2017-10-13 DIAGNOSIS — A4102 Sepsis due to Methicillin resistant Staphylococcus aureus: Secondary | ICD-10-CM | POA: Diagnosis present

## 2017-10-13 DIAGNOSIS — J9690 Respiratory failure, unspecified, unspecified whether with hypoxia or hypercapnia: Secondary | ICD-10-CM | POA: Diagnosis not present

## 2017-10-13 DIAGNOSIS — Z66 Do not resuscitate: Secondary | ICD-10-CM | POA: Diagnosis present

## 2017-10-13 DIAGNOSIS — J690 Pneumonitis due to inhalation of food and vomit: Secondary | ICD-10-CM | POA: Diagnosis not present

## 2017-10-13 DIAGNOSIS — Z9981 Dependence on supplemental oxygen: Secondary | ICD-10-CM | POA: Diagnosis not present

## 2017-10-13 DIAGNOSIS — Z85818 Personal history of malignant neoplasm of other sites of lip, oral cavity, and pharynx: Secondary | ICD-10-CM | POA: Insufficient documentation

## 2017-10-13 DIAGNOSIS — C9500 Acute leukemia of unspecified cell type not having achieved remission: Secondary | ICD-10-CM | POA: Diagnosis not present

## 2017-10-13 DIAGNOSIS — T8642 Liver transplant failure: Secondary | ICD-10-CM | POA: Diagnosis not present

## 2017-10-13 DIAGNOSIS — Z9221 Personal history of antineoplastic chemotherapy: Secondary | ICD-10-CM | POA: Diagnosis not present

## 2017-10-13 DIAGNOSIS — J9601 Acute respiratory failure with hypoxia: Secondary | ICD-10-CM | POA: Diagnosis present

## 2017-10-13 DIAGNOSIS — Z8673 Personal history of transient ischemic attack (TIA), and cerebral infarction without residual deficits: Secondary | ICD-10-CM | POA: Diagnosis not present

## 2017-10-13 DIAGNOSIS — I70209 Unspecified atherosclerosis of native arteries of extremities, unspecified extremity: Secondary | ICD-10-CM | POA: Diagnosis not present

## 2017-10-13 DIAGNOSIS — C9510 Chronic leukemia of unspecified cell type not having achieved remission: Secondary | ICD-10-CM | POA: Diagnosis present

## 2017-10-13 DIAGNOSIS — Z955 Presence of coronary angioplasty implant and graft: Secondary | ICD-10-CM | POA: Diagnosis not present

## 2017-10-13 DIAGNOSIS — E561 Deficiency of vitamin K: Secondary | ICD-10-CM | POA: Diagnosis not present

## 2017-10-13 DIAGNOSIS — E876 Hypokalemia: Secondary | ICD-10-CM | POA: Diagnosis present

## 2017-10-13 DIAGNOSIS — J90 Pleural effusion, not elsewhere classified: Secondary | ICD-10-CM | POA: Diagnosis present

## 2017-10-13 DIAGNOSIS — R6521 Severe sepsis with septic shock: Secondary | ICD-10-CM | POA: Diagnosis present

## 2017-10-13 DIAGNOSIS — C9592 Leukemia, unspecified, in relapse: Secondary | ICD-10-CM | POA: Diagnosis not present

## 2017-10-13 DIAGNOSIS — N179 Acute kidney failure, unspecified: Secondary | ICD-10-CM | POA: Diagnosis present

## 2017-10-13 DIAGNOSIS — A4902 Methicillin resistant Staphylococcus aureus infection, unspecified site: Secondary | ICD-10-CM | POA: Diagnosis not present

## 2017-10-13 DIAGNOSIS — R471 Dysarthria and anarthria: Secondary | ICD-10-CM | POA: Diagnosis not present

## 2017-10-13 DIAGNOSIS — J984 Other disorders of lung: Secondary | ICD-10-CM | POA: Diagnosis not present

## 2017-10-13 DIAGNOSIS — J918 Pleural effusion in other conditions classified elsewhere: Secondary | ICD-10-CM | POA: Diagnosis not present

## 2017-10-13 DIAGNOSIS — I739 Peripheral vascular disease, unspecified: Secondary | ICD-10-CM | POA: Diagnosis present

## 2017-10-13 DIAGNOSIS — I272 Pulmonary hypertension, unspecified: Secondary | ICD-10-CM | POA: Diagnosis not present

## 2017-10-13 DIAGNOSIS — R634 Abnormal weight loss: Secondary | ICD-10-CM | POA: Diagnosis not present

## 2017-10-13 DIAGNOSIS — J9811 Atelectasis: Secondary | ICD-10-CM | POA: Diagnosis not present

## 2017-10-13 DIAGNOSIS — R221 Localized swelling, mass and lump, neck: Secondary | ICD-10-CM | POA: Diagnosis not present

## 2017-10-13 DIAGNOSIS — I313 Pericardial effusion (noninflammatory): Secondary | ICD-10-CM | POA: Diagnosis not present

## 2017-10-13 DIAGNOSIS — R1312 Dysphagia, oropharyngeal phase: Secondary | ICD-10-CM | POA: Diagnosis not present

## 2017-10-13 HISTORY — DX: Leukemia, unspecified not having achieved remission: C95.90

## 2017-10-13 LAB — MAGNESIUM: MAGNESIUM: 2.1 mg/dL (ref 1.7–2.4)

## 2017-10-13 LAB — COMPREHENSIVE METABOLIC PANEL
ALBUMIN: 2.2 g/dL — AB (ref 3.5–5.0)
ALT: 24 U/L (ref 17–63)
ANION GAP: 14 (ref 5–15)
AST: 44 U/L — ABNORMAL HIGH (ref 15–41)
Alkaline Phosphatase: 110 U/L (ref 38–126)
BILIRUBIN TOTAL: 1 mg/dL (ref 0.3–1.2)
BUN: 54 mg/dL — ABNORMAL HIGH (ref 6–20)
CO2: 18 mmol/L — ABNORMAL LOW (ref 22–32)
Calcium: 7.6 mg/dL — ABNORMAL LOW (ref 8.9–10.3)
Chloride: 97 mmol/L — ABNORMAL LOW (ref 101–111)
Creatinine, Ser: 2.15 mg/dL — ABNORMAL HIGH (ref 0.61–1.24)
GFR calc non Af Amer: 30 mL/min — ABNORMAL LOW (ref >60)
GFR, EST AFRICAN AMERICAN: 34 mL/min — AB (ref >60)
GLUCOSE: 145 mg/dL — AB (ref 65–99)
POTASSIUM: 3.7 mmol/L (ref 3.5–5.1)
Sodium: 129 mmol/L — ABNORMAL LOW (ref 135–145)
TOTAL PROTEIN: 5.7 g/dL — AB (ref 6.5–8.1)

## 2017-10-13 LAB — I-STAT CG4 LACTIC ACID, ED
Lactic Acid, Venous: 3.32 mmol/L (ref 0.5–1.9)
Lactic Acid, Venous: 1.42 mmol/L (ref 0.5–1.9)

## 2017-10-13 LAB — PROTIME-INR
INR: 1.29
Prothrombin Time: 16 seconds — ABNORMAL HIGH (ref 11.4–15.2)

## 2017-10-13 LAB — TROPONIN I: Troponin I: <0.03 ng/mL (ref <0.03)

## 2017-10-13 LAB — BRAIN NATRIURETIC PEPTIDE: B Natriuretic Peptide: 105.3 pg/mL — ABNORMAL HIGH (ref 0.0–100.0)

## 2017-10-13 LAB — LIPASE, BLOOD: Lipase: 27 U/L (ref 11–51)

## 2017-10-13 MED ORDER — PIPERACILLIN-TAZOBACTAM 3.375 G IVPB: 3.3750 g | Freq: Three times a day (TID) | INTRAVENOUS | Status: DC

## 2017-10-13 MED ORDER — SODIUM CHLORIDE 0.9 % IV BOLUS (SEPSIS)
1000.0000 mL | Freq: Once | INTRAVENOUS | Status: AC
  Administered 2017-10-13: 1000 mL via INTRAVENOUS

## 2017-10-13 MED ORDER — NOREPINEPHRINE BITARTRATE 1 MG/ML IV SOLN
0.0000 ug/min | Freq: Once | INTRAVENOUS | Status: AC
  Administered 2017-10-13: 2 ug/min via INTRAVENOUS
  Filled 2017-10-13: qty 4

## 2017-10-13 MED ORDER — PIPERACILLIN-TAZOBACTAM 3.375 G IVPB 30 MIN
3.3750 g | Freq: Once | INTRAVENOUS | Status: AC
  Administered 2017-10-13: 3.375 g via INTRAVENOUS
  Filled 2017-10-13 (×2): qty 50

## 2017-10-13 MED ORDER — VANCOMYCIN HCL IN DEXTROSE 1-5 GM/200ML-% IV SOLN
1000.0000 mg | Freq: Once | INTRAVENOUS | Status: AC
  Administered 2017-10-13: 1000 mg via INTRAVENOUS
  Filled 2017-10-13: qty 200

## 2017-10-13 MED ORDER — SODIUM CHLORIDE 0.9 % IV BOLUS
1000.0000 mL | Freq: Once | INTRAVENOUS | Status: AC
  Administered 2017-10-13: 1000 mL via INTRAVENOUS

## 2017-10-13 MED ORDER — VANCOMYCIN HCL 500 MG IV SOLR
500.0000 mg | INTRAVENOUS | Status: DC
  Filled 2017-10-13: qty 500

## 2017-10-13 MED ORDER — SODIUM CHLORIDE 0.9 % IV BOLUS: 1000.0000 mL | Freq: Once | INTRAVENOUS | Status: DC

## 2017-10-13 NOTE — Progress Notes (Signed)
Pharmacy Antibiotic Note  [name] [name] is a 67 y.o. male admitted on 10/13/2017 with sepsis.  Pharmacy has been consulted for vancomycin and Zosyn dosing.  Labs/Vitals: Tm 100.3, HR 123, RR 35, LA 3.32 Scr 2.15; CrCl ~22  Plan: Vancomycin 500 mg IV every 24 hours.  Goal trough 15-20 mcg/mL. Zosyn 3.375g IV q8h (4 hour infusion). F/U cultures, LOT, and vancomycin trough as needed.  Weight: 106 lb (48.1 kg)  Temp (24hrs), Avg:99.3 F (37.4 C), Min:98.3 F (36.8 C), Max:100.3 F (37.9 C)  Recent Labs  Lab 10/13/17 1507 10/13/17 1508  CREATININE  --  2.15*  LATICACIDVEN 3.32*  --     Estimated Creatinine Clearance: 22.1 mL/min (A) (by C-G formula based on SCr of 2.15 mg/dL (H)).    No Known Allergies  Antimicrobials this admission: Vancomycin 4/10 >> Zosyn 4/10 >>  Dose adjustments this admission: none  Microbiology results: BCx ordered.  Thank you for allowing pharmacy to be a part of this patient's care.  Blaine Hamper Jozef Eisenbeis 10/13/2017 4:14 PM

## 2017-10-13 NOTE — ED Notes (Signed)
Spoke with Sonia Side with transfer at [facility].  States patient to go to bed C811.  Call report number [phone]; charge RN number [phone].  States they will arrange transport for patient via [facility].

## 2017-10-13 NOTE — ED Notes (Signed)
ED Provider at bedside. 

## 2017-10-13 NOTE — ED Triage Notes (Signed)
Per son pt with SOB since 12pm-pt with labored breathing-taken to tx room 12

## 2017-10-13 NOTE — ED Notes (Signed)
Pt removed off bipap per patient request. Pt states with Son at bedside "I dont want that mask anymore."  MD made aware. Placed pt on 100% NRB.

## 2017-10-13 NOTE — ED Provider Notes (Signed)
[place] EMERGENCY DEPARTMENT Provider Note   CSN: [id number] Arrival date & time: 10/13/17  1446     History   Chief Complaint Chief Complaint  Patient presents with  . Shortness of Breath    HPI [name] MARRAZZO is a 67 y.o. male.  The history is provided by the patient, a relative and medical records.  Shortness of Breath  This is a new problem. The average episode lasts 3 days. The problem occurs continuously.The current episode started more than 2 days ago. The problem has been rapidly worsening. Associated symptoms include a fever, cough and chest pain. Pertinent negatives include no headaches, no rhinorrhea, no neck pain, no sputum production, no hemoptysis, no wheezing, no syncope, no vomiting, no abdominal pain, no rash, no leg pain and no leg swelling. He has tried nothing for the symptoms. The treatment provided no relief. He has had prior hospitalizations.    Past Medical History:  Diagnosis Date  . Cancer (HCC)    throat, lymph node  . Carotid artery occlusion   . GERD (gastroesophageal reflux disease)   . Hyperlipemia   . Leukemia ([place])   . PVD (peripheral vascular disease) ([place])   . Stroke [facility])     Patient Active Problem List   Diagnosis Date Noted  . Cerebrovascular accident (CVA) due to thrombosis of left middle cerebral artery ([place]) 08/01/2015  . Dysphagia 04/25/2014  . Occlusion and stenosis of carotid artery without mention of cerebral infarction 03/14/2014  . Carotid stenosis 01/16/2014  . Right sided weakness 01/13/2014  . Acute ischemic stroke ([place]) 01/13/2014  . PVD (peripheral vascular disease) ([place]) 01/13/2014  . Other and unspecified hyperlipidemia 01/13/2014  . GERD (gastroesophageal reflux disease) 01/13/2014  . 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 ARCH AORTOGRAM N/A 03/19/2014   Procedure: ARCH AORTOGRAM;  Surgeon: Angelia Mould, MD;  Location:  [facility] CATH LAB;  Service: Cardiovascular;  Laterality: N/A;  . CEREBRAL ANGIOGRAM N/A 03/19/2014   Procedure: CEREBRAL ANGIOGRAM;  Surgeon: Angelia Mould, MD;  Location: [facility] CATH LAB;  Service: Cardiovascular;  Laterality: N/A;  . CHOLECYSTECTOMY    . LYMPHADENECTOMY Right 2009   neck  . PERIPHERAL VASCULAR CATHETERIZATION Right 04/17/2016   Procedure: Carotid PTA/Stent Intervention;  Surgeon: Elam Dutch, MD;  Location: [place] CV LAB;  Service: Cardiovascular;  Laterality: Right;  . PR VEIN BYPASS GRAFT,AORTO-FEM-POP          Home Medications    Prior to Admission medications   Medication Sig Start Date End Date Taking? Authorizing Provider  allopurinol (ZYLOPRIM) 100 MG tablet Take 200 mg by mouth. 05/25/17   [provider]  aspirin EC 325 MG tablet Take 325 mg by mouth daily.    [provider]  atorvastatin (LIPITOR) 40 MG tablet Take 20 mg by mouth at bedtime.    [provider]  clopidogrel (PLAVIX) 75 MG tablet Take 1 tablet (75 mg total) by mouth daily. 02/14/14   Rosalin Hawking, MD  loratadine (CLARITIN) 10 MG tablet Take 10 mg by mouth at bedtime.     [provider]    Family History Family History  Problem Relation Age of Onset  . Lung cancer Mother   . Cancer Mother   . CAD Father   . Heart disease Father     Social History Social History   Tobacco Use  . Smoking status: Former Smoker  Last attempt to quit: 03/14/2001    Years since quitting: 16.5  . Smokeless tobacco: Never Used  Substance Use Topics  . Alcohol use: No    Alcohol/week: 0.0 oz  . Drug use: No     Allergies   Patient has no known allergies.   Review of Systems Review of Systems  Constitutional: Positive for chills and fever. Negative for diaphoresis and fatigue.  HENT: Negative for congestion and rhinorrhea.   Respiratory: Positive for cough, chest tightness and shortness of breath. Negative for hemoptysis, sputum production and  wheezing.   Cardiovascular: Positive for chest pain. Negative for leg swelling and syncope.  Gastrointestinal: Negative for abdominal pain, constipation, diarrhea, nausea and vomiting.  Genitourinary: Negative for flank pain and frequency.  Musculoskeletal: Negative for back pain, neck pain and neck stiffness.  Skin: Negative for rash and wound.  Neurological: Negative for light-headedness and headaches.  Psychiatric/Behavioral: Negative for agitation.  All other systems reviewed and are negative.    Physical Exam Updated Vital Signs BP 102/61 (BP Location: Left Arm)   Pulse (!) 120   Temp 98.3 F (36.8 C) (Oral)   Resp (!) 36   Wt 48.1 kg (106 lb)   BMI 18.19 kg/m   Physical Exam  Constitutional: He appears well-developed and well-nourished.  Non-toxic appearance. He appears ill. He appears distressed.  HENT:  Head: Normocephalic.  Eyes: Pupils are equal, round, and reactive to light. EOM are normal.  Cardiovascular: Normal heart sounds. Tachycardia present.  Pulmonary/Chest: Accessory muscle usage present. No stridor. Tachypnea noted. He is in respiratory distress. He has no wheezes. He has rhonchi. He has no rales. He exhibits no tenderness and no laceration.  Abdominal: Soft. He exhibits no distension. There is no tenderness.  Musculoskeletal:       Right lower leg: He exhibits no edema.       Left lower leg: He exhibits no edema.  Neurological: He is alert.  Skin: No rash noted. He is not diaphoretic. No erythema.  Psychiatric: His mood appears anxious.  Nursing note and vitals reviewed.    ED Treatments / Results  Labs (all labs ordered are listed, but only abnormal results are displayed) Labs Reviewed  CBC WITH DIFFERENTIAL/PLATELET - Abnormal; Notable for the following components:      Result Value   WBC 70.9 (*)    RBC 2.86 (*)    Hemoglobin 8.4 (*)    HCT 25.8 (*)    RDW 18.5 (*)    Platelets 117 (*)    All other components within normal limits    COMPREHENSIVE METABOLIC PANEL - Abnormal; Notable for the following components:   Sodium 129 (*)    Chloride 97 (*)    CO2 18 (*)    Glucose, Bld 145 (*)    BUN 54 (*)    Creatinine, Ser 2.15 (*)    Calcium 7.6 (*)    Total Protein 5.7 (*)    Albumin 2.2 (*)    AST 44 (*)    GFR calc non Af Amer 30 (*)    GFR calc Af Amer 34 (*)    All other components within normal limits  PROTIME-INR - Abnormal; Notable for the following components:   Prothrombin Time 16.0 (*)    All other components within normal limits  BRAIN NATRIURETIC PEPTIDE - Abnormal; Notable for the following components:   B Natriuretic Peptide 105.3 (*)    All other components within normal limits  I-STAT CG4 LACTIC ACID, ED -  Abnormal; Notable for the following components:   Lactic Acid, Venous 3.32 (*)    All other components within normal limits  URINE CULTURE  CULTURE, BLOOD (ROUTINE X 2)  CULTURE, BLOOD (ROUTINE X 2)  TROPONIN I  LIPASE, BLOOD  MAGNESIUM  TROPONIN I  URINALYSIS, ROUTINE W REFLEX MICROSCOPIC  PATHOLOGIST SMEAR REVIEW  I-STAT CG4 LACTIC ACID, ED  I-STAT CG4 LACTIC ACID, ED    EKG EKG Interpretation  Date/Time:  Wednesday October 13 2017 15:12:39 EDT Ventricular Rate:  113 PR Interval:    QRS Duration: 86 QT Interval:  301 QTC Calculation: 413 R Axis:   72 Text Interpretation:  Sinus tachycardia Borderline repolarization abnormality When compared to prior, faster rate.  no stemi Confirmed by Antony Blackbird [phone]) on 10/13/2017 3:28:14 PM   Radiology Dg Chest Portable 1 View  Result Date: 10/13/2017 CLINICAL DATA:  Shortness of breath and hypoxia beginning today. EXAM: PORTABLE CHEST 1 VIEW COMPARISON:  03/11/2018 FINDINGS: Power port inserted from a right subclavian approach has its tip at the SVC RA junction. Infiltrate in the right lower lobe consistent with bronchopneumonia. Question early patchy infiltrate in the left lower lobe. Upper lungs are clear. No effusions. IMPRESSION:  Right lower lobe consolidation consistent with pneumonia. Question mild patchy infiltrate in the left lower lobe. Electronically Signed   By: Nelson Chimes M.D.   On: 10/13/2017 15:22    Procedures Procedures (including critical care time)   CRITICAL CARE Performed by: Gwenyth Allegra Tegeler Total critical care time: 60 minutes Critical care time was exclusive of separately billable procedures and treating other patients. Critical care was necessary to treat or prevent imminent or life-threatening deterioration. Critical care was time spent personally by me on the following activities: development of treatment plan with patient and/or surrogate as well as nursing, discussions with consultants, evaluation of patient's response to treatment, examination of patient, obtaining history from patient or surrogate, ordering and performing treatments and interventions, ordering and review of laboratory studies, ordering and review of radiographic studies, pulse oximetry and re-evaluation of patient's condition.   Medications Ordered in ED Medications  piperacillin-tazobactam (ZOSYN) IVPB 3.375 g (has no administration in time range)  vancomycin (VANCOCIN) 500 mg in sodium chloride 0.9 % 100 mL IVPB (has no administration in time range)  sodium chloride 0.9 % bolus 1,000 mL (0 mLs Intravenous Stopped 10/13/17 1719)  sodium chloride 0.9 % bolus 1,000 mL (0 mLs Intravenous Stopped 10/13/17 1558)  piperacillin-tazobactam (ZOSYN) IVPB 3.375 g (0 g Intravenous Stopped 10/13/17 1558)  vancomycin (VANCOCIN) IVPB 1000 mg/200 mL premix (0 mg Intravenous Stopped 10/13/17 1719)     Initial Impression / Assessment and Plan / ED Course  I have reviewed the triage vital signs and the nursing notes.  Pertinent labs & imaging results that were available during my care of the patient were reviewed by me and considered in my medical decision making (see chart for details).     [name] [name] is a 67 y.o. male with a  past medical history significant for prior leukemia, prior stroke on aspirin Plavix, peripheral vascular disease, GERD, verbal report of prior blood clot, and hyperlipidemia who presents for respiratory distress, chills, cough, and diffuse pain.  Patient is coming by his son who reports that today his father has had worsening shortness of breath and pain.  Patient reports having pain all over including his right chest.  He denies any nausea, vomiting, conservation, diarrhea, dysuria.  He does report some chills but did not  have fevers at home.  He reports that he had worsening shortness of breath and chest tightness.  When patient arrived in triage, patient's oxygen saturations were in the 60% range on room air.  Patient was quickly brought back to exam room for evaluation.  Next  On my exam, patient has coarse breath sounds in all lung fields.  Chest is nontender.  Abdomen is nontender.  Patient has palpable pulses in all extremities.  Patient is alert however son reports that he has a difficult time speaking at baseline due to prior stroke.  Patient was placed on bilevel breathing support shortly after arrival.  Patient's oxygen saturations improved into the 90s on BiPAP.  Patient's vital signs also revealed persistent tachycardia, soft blood pressures with pressures in the 65L and 93T systolic, and a rectal temperature 100.3.  Given the patient's report of ongoing chemotherapy and these vital signs concerning for infection, patient will be made a code sepsis.  Patient was given broad spectrum antibiotics/   Based on the patient's cough and hypoxia I am concerned about both pneumonia or pulmonary embolism with the report of prior blood clot.  Patient also workup to look for other occult infection.  Portable chest x-ray was obtained.  X-ray was reviewed at the bedside by me showing concern for consolidation in the right lower lobe.  Given the appearance, I am strongly considering pneumonia or even a  Hampton's hump.  As patient and family report that his care has been at [facility] health for his leukemia management, anticipate patient will require admission to [facility].  6:04 PM [facility] health was called and I spoke with the MICU oncology team.  Patient will be admitted to the MICU O service under Dr. Rosaria Ferries.  Patient has been de-escalated to Nonrebreather from Bipap as he appears to breathe more through his mouth and his nose.  Patient is off of BiPAP.  Patient reports he does not want to go back on BiPAP and family reports that he does have a DNR in place.  We do not have the documentation for this but both patient nodded and family repeated he does not want CPR or intubation.  Awaiting transportation to take him to [facility] for admission for his sepsis, pneumonia, and respiratory failure.  Patient had worsening blood pressure and was started on levo fed prior to transfer.  Patient remained on a nonrebreather with Levophed for pressure support as transportation got him to take him to his MICU bed at [facility] health.   Final Clinical Impressions(s) / ED Diagnoses   Final diagnoses:  Sepsis due to pneumonia [facility])  Respiratory distress  Shortness of breath  Cough     Clinical Impression: 1. Sepsis due to pneumonia ([place])   2. Respiratory distress   3. Shortness of breath   4. Cough     Disposition: Admit to [facility] MICU-O service  This note was prepared with assistance of Dragon voice recognition software. Occasional wrong-word or sound-a-like substitutions may have occurred due to the inherent limitations of voice recognition software.     Tegeler, Gwenyth Allegra, MD 10/14/17 [phone]

## 2017-10-14 LAB — CBC WITH DIFFERENTIAL/PLATELET
BAND NEUTROPHILS: 0 %
BASOS ABS: 0 10*3/uL (ref 0.0–0.1)
BASOS PCT: 0 %
Blasts: 0 %
Eosinophils Absolute: 0 10*3/uL (ref 0.0–0.7)
Eosinophils Relative: 0 %
HEMATOCRIT: 25.8 % — AB (ref 39.0–52.0)
Hemoglobin: 8.4 g/dL — ABNORMAL LOW (ref 13.0–17.0)
Lymphocytes Relative: 7 %
Lymphs Abs: 5 10*3/uL — ABNORMAL HIGH (ref 0.7–4.0)
MCH: 29.4 pg (ref 26.0–34.0)
MCHC: 32.6 g/dL (ref 30.0–36.0)
MCV: 90.2 fL (ref 78.0–100.0)
METAMYELOCYTES PCT: 4 %
MONOS PCT: 14 %
Monocytes Absolute: 9.9 10*3/uL — ABNORMAL HIGH (ref 0.1–1.0)
Myelocytes: 6 %
NEUTROS ABS: 56 10*3/uL — AB (ref 1.7–7.7)
Neutrophils Relative %: 69 %
Other: 0 %
Platelets: 117 10*3/uL — ABNORMAL LOW (ref 150–400)
Promyelocytes Relative: 0 %
RBC: 2.86 MIL/uL — ABNORMAL LOW (ref 4.22–5.81)
RDW: 18.5 % — AB (ref 11.5–15.5)
WBC: 70.9 10*3/uL (ref 4.0–10.5)
nRBC: 0 /100 WBC

## 2017-10-14 LAB — PATHOLOGIST SMEAR REVIEW

## 2017-10-16 LAB — BLOOD CULTURE ID PANEL (REFLEXED)
ACINETOBACTER BAUMANNII: NOT DETECTED
CANDIDA PARAPSILOSIS: NOT DETECTED
CANDIDA TROPICALIS: NOT DETECTED
Candida albicans: NOT DETECTED
Candida glabrata: NOT DETECTED
Candida krusei: NOT DETECTED
Enterobacter cloacae complex: NOT DETECTED
Enterobacteriaceae species: NOT DETECTED
Enterococcus species: NOT DETECTED
Escherichia coli: NOT DETECTED
Haemophilus influenzae: NOT DETECTED
KLEBSIELLA OXYTOCA: NOT DETECTED
Klebsiella pneumoniae: NOT DETECTED
Listeria monocytogenes: NOT DETECTED
METHICILLIN RESISTANCE: DETECTED — AB
Neisseria meningitidis: NOT DETECTED
PSEUDOMONAS AERUGINOSA: NOT DETECTED
Proteus species: NOT DETECTED
SERRATIA MARCESCENS: NOT DETECTED
STREPTOCOCCUS PNEUMONIAE: NOT DETECTED
Staphylococcus aureus (BCID): DETECTED — AB
Staphylococcus species: DETECTED — AB
Streptococcus agalactiae: NOT DETECTED
Streptococcus pyogenes: NOT DETECTED
Streptococcus species: NOT DETECTED

## 2017-10-18 ENCOUNTER — Other Ambulatory Visit: Payer: Self-pay

## 2017-10-18 DIAGNOSIS — I6523 Occlusion and stenosis of bilateral carotid arteries: Secondary | ICD-10-CM

## 2017-10-18 LAB — CULTURE, BLOOD (ROUTINE X 2)
Culture: NO GROWTH
Special Requests: ADEQUATE

## 2017-10-19 MED ORDER — GENERIC EXTERNAL MEDICATION
1.25 g | Status: DC
Start: 2017-10-19 — End: 2017-10-19

## 2017-10-19 MED ORDER — GENERIC EXTERNAL MEDICATION
Status: DC
Start: ? — End: 2017-10-19

## 2017-10-19 MED ORDER — HEPARIN SODIUM (PORCINE) 5000 UNIT/ML IJ SOLN
5000.00 | INTRAMUSCULAR | Status: DC
Start: 2017-10-19 — End: 2017-10-19

## 2017-10-19 MED ORDER — PNEUMOCOCCAL VAC POLYVALENT 25 MCG/0.5ML IJ INJ
.50 | INJECTION | INTRAMUSCULAR | Status: DC
Start: ? — End: 2017-10-19

## 2017-10-19 MED ORDER — ASPIRIN EC 325 MG PO TBEC
325.00 | DELAYED_RELEASE_TABLET | ORAL | Status: DC
Start: 2017-10-19 — End: 2017-10-19

## 2017-10-19 MED ORDER — HYDROCODONE-ACETAMINOPHEN 5-325 MG PO TABS
1.00 | ORAL_TABLET | ORAL | Status: DC
Start: ? — End: 2017-10-19

## 2017-10-19 MED ORDER — ONDANSETRON 4 MG PO TBDP
4.00 | ORAL_TABLET | ORAL | Status: DC
Start: ? — End: 2017-10-19

## 2017-10-19 MED ORDER — THIAMINE HCL 100 MG PO TABS
100.00 | ORAL_TABLET | ORAL | Status: DC
Start: 2017-10-19 — End: 2017-10-19

## 2017-10-19 MED ORDER — GENERIC EXTERNAL MEDICATION
2.00 | Status: DC
Start: ? — End: 2017-10-19

## 2017-10-19 MED ORDER — DEXTROSE 10 % IV SOLN
125.00 | INTRAVENOUS | Status: DC
Start: ? — End: 2017-10-19

## 2017-10-19 MED ORDER — SODIUM CHLORIDE 0.9 % IJ SOLN
20.00 | INTRAMUSCULAR | Status: DC
Start: 2017-10-19 — End: 2017-10-19

## 2017-10-19 MED ORDER — ALLOPURINOL 100 MG PO TABS
200.00 | ORAL_TABLET | ORAL | Status: DC
Start: 2017-10-19 — End: 2017-10-19

## 2017-10-19 MED ORDER — LACTATED RINGERS IV SOLN
500.00 | INTRAVENOUS | Status: DC
Start: 2017-10-19 — End: 2017-10-19

## 2017-10-19 MED ORDER — SODIUM CHLORIDE 0.9 % IV SOLN
250.00 | INTRAVENOUS | Status: DC
Start: ? — End: 2017-10-19

## 2017-10-19 MED ORDER — SODIUM CHLORIDE 0.9 % IJ SOLN
20.00 | INTRAMUSCULAR | Status: DC
Start: ? — End: 2017-10-19

## 2017-10-19 MED ORDER — ATORVASTATIN CALCIUM 10 MG PO TABS
20.00 | ORAL_TABLET | ORAL | Status: DC
Start: 2017-10-19 — End: 2017-10-19

## 2017-11-03 ENCOUNTER — Other Ambulatory Visit (HOSPITAL_COMMUNITY): Payer: Medicare Other

## 2017-11-03 ENCOUNTER — Encounter (HOSPITAL_COMMUNITY): Payer: Medicare Other

## 2017-11-03 ENCOUNTER — Ambulatory Visit: Payer: Medicare Other | Admitting: Family

## 2017-11-03 DEATH — deceased

## 2017-11-04 ENCOUNTER — Other Ambulatory Visit (HOSPITAL_COMMUNITY): Payer: Medicare Other

## 2017-11-04 ENCOUNTER — Ambulatory Visit: Payer: Medicare Other | Admitting: Family

## 2017-11-04 ENCOUNTER — Encounter (HOSPITAL_COMMUNITY): Payer: Medicare Other

## 2019-06-13 IMAGING — CT CT HEAD W/O CM
4 series · 16 of 33 positions shown, 19 images · non-contrast
Comparison: March 11, 2017

CLINICAL DATA: Known carotid stenosis. History of throat carcinoma.
Previous lower extremity numbness

EXAM:
CT HEAD WITHOUT CONTRAST
TECHNIQUE: Contiguous axial images were obtained from the base of the skull
through the vertex without intravenous contrast.

[Series 3: head bone · axial · 0.49mm/px · z∈[-145,-45]mm · 5 of 32 slices shown, 7 images]
[im 6/32  soft-tissue]
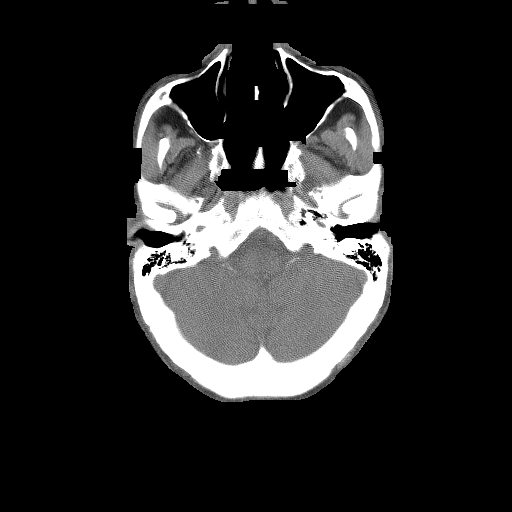
[im 6/32  bone]
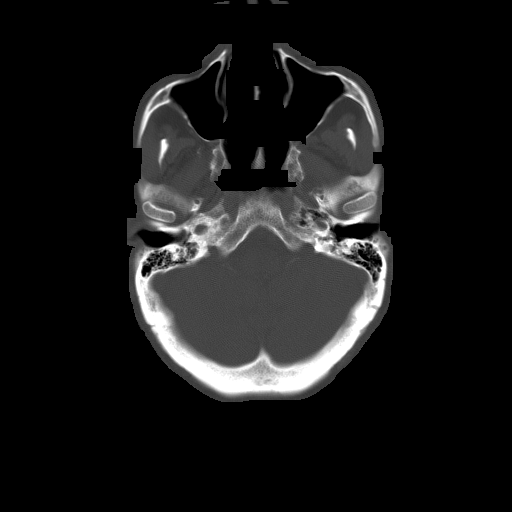
[im 11/32  bone]
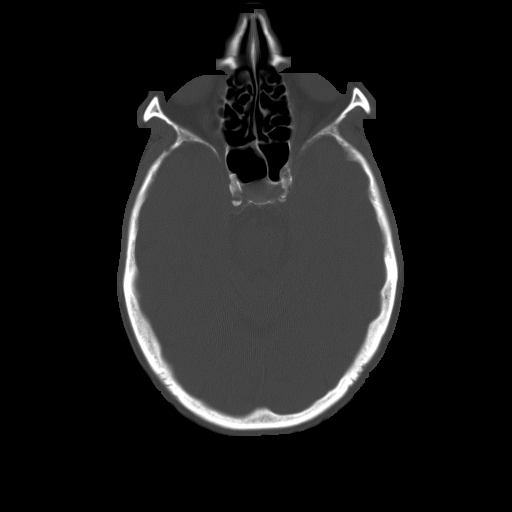
[im 16/32  bone]
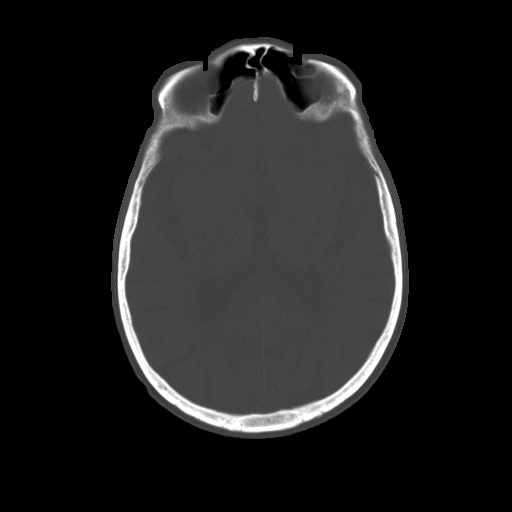
[im 21/32  bone]
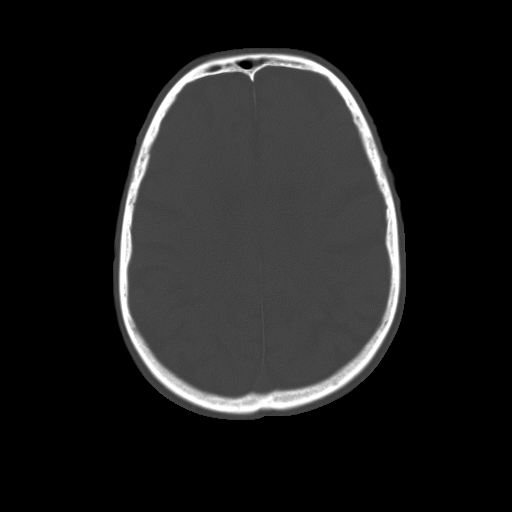
[im 26/32  soft-tissue]
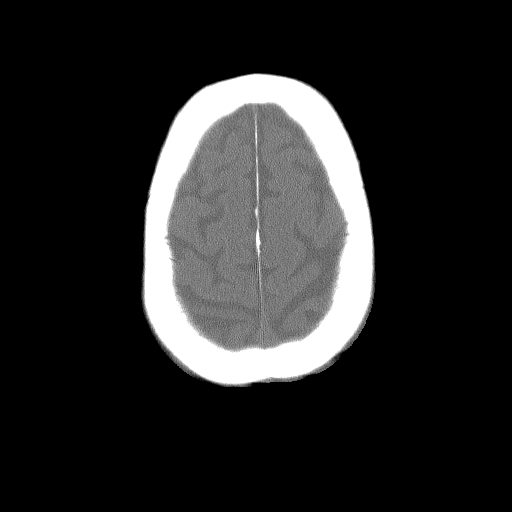
[im 26/32  bone]
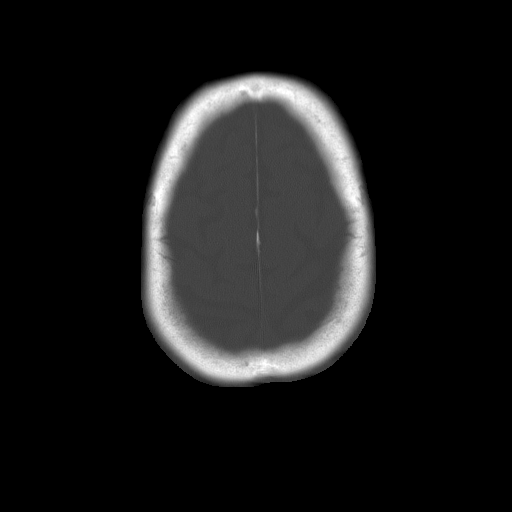

[Series 32: 3d filtered head w/o · axial · non-contrast · 0.49mm/px · z∈[-145,-95]mm · 3 of 32 slices shown]
[im 6/32  bone]
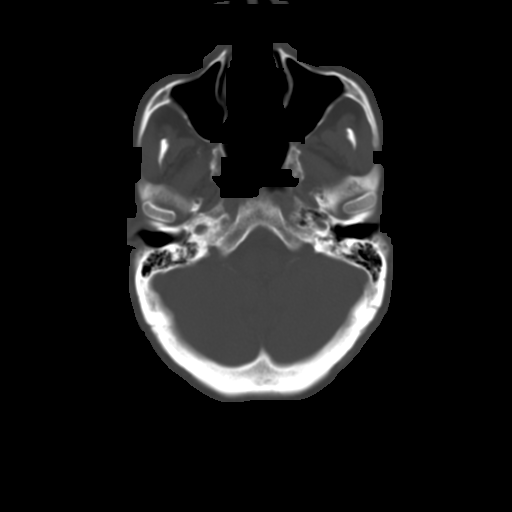
[im 11/32  bone]
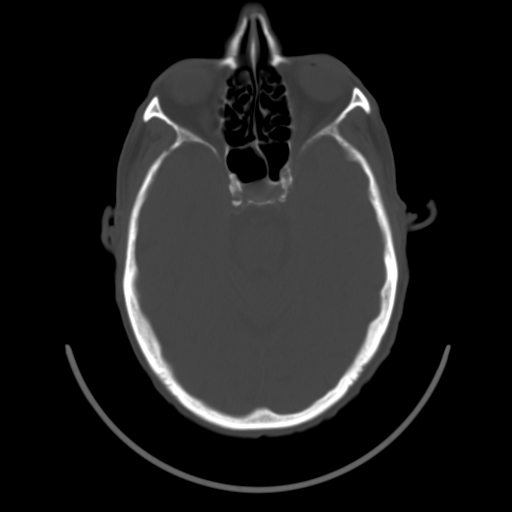
[im 16/32  bone]
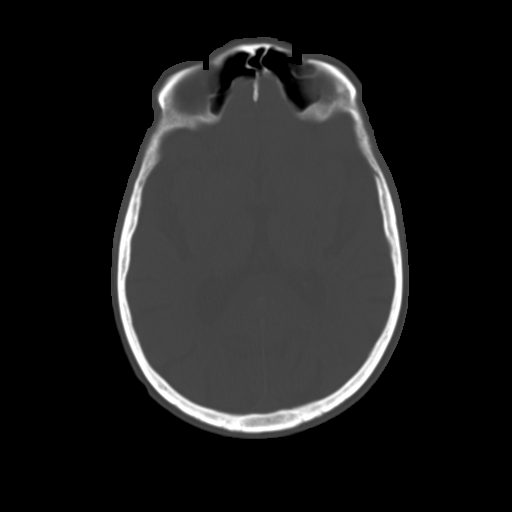

[Series 601: coronal brain · coronal · 0.49mm/px · 3 of 73 slices shown]
[im 15/73  bone]
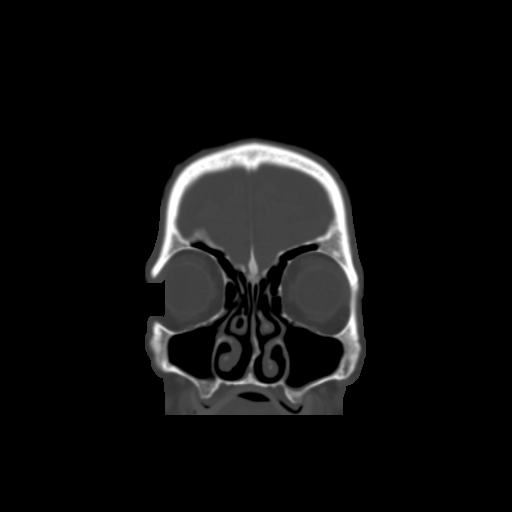
[im 29/73  bone]
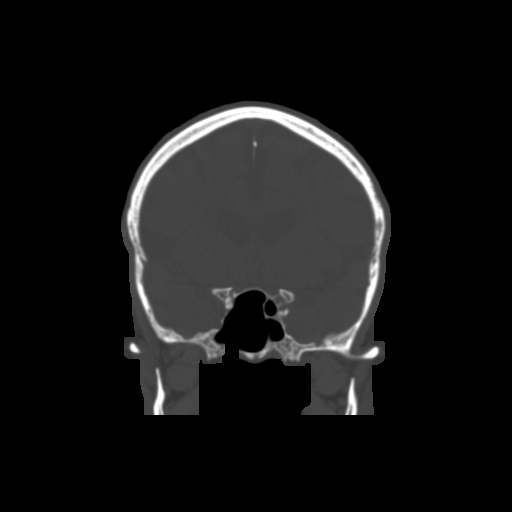
[im 44/73  bone]
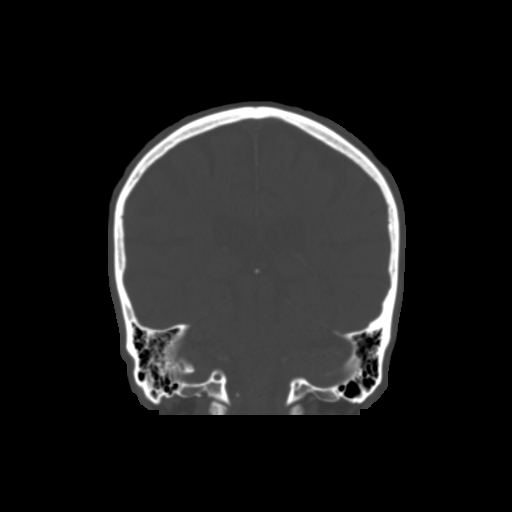

[Series 602: sagittal brain · sagittal · 0.49mm/px · 5 of 53 slices shown, 6 images]
[im 18/53  bone]
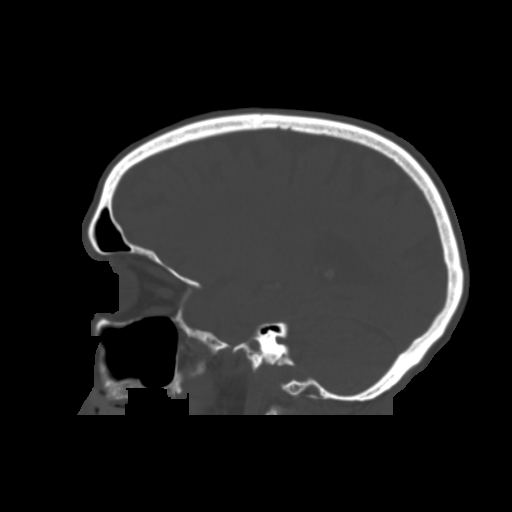
[im 22/53  bone]
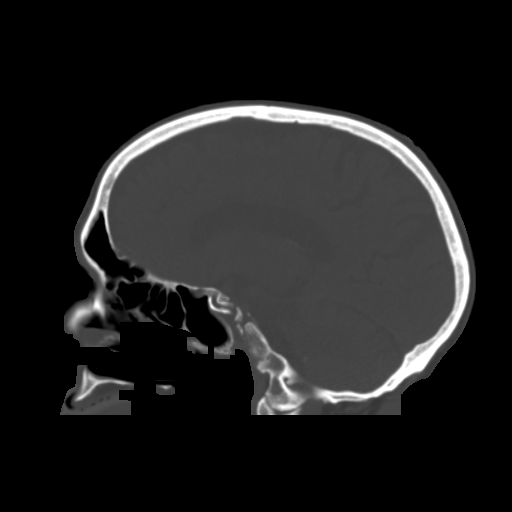
[im 27/53  soft-tissue]
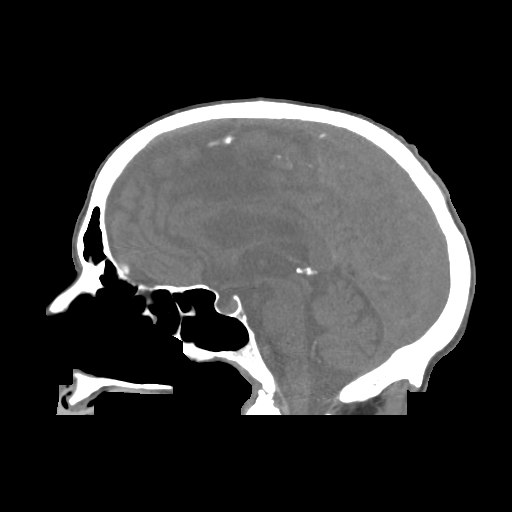
[im 27/53  bone]
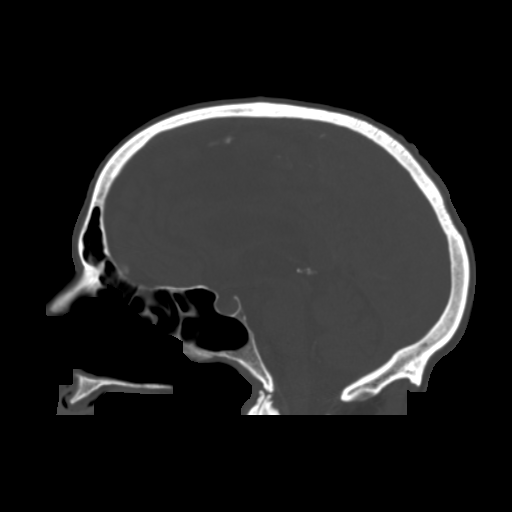
[im 31/53  bone]
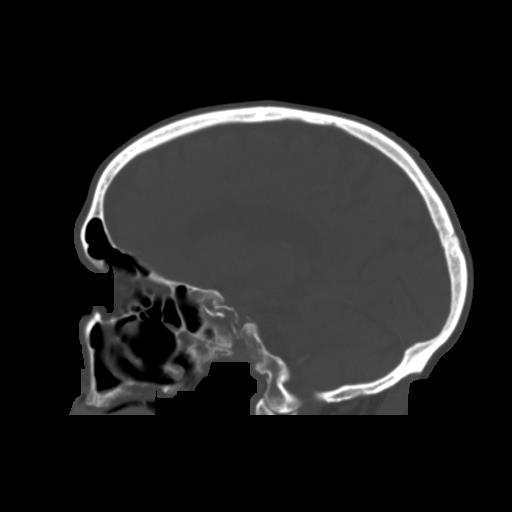
[im 35/53  bone]
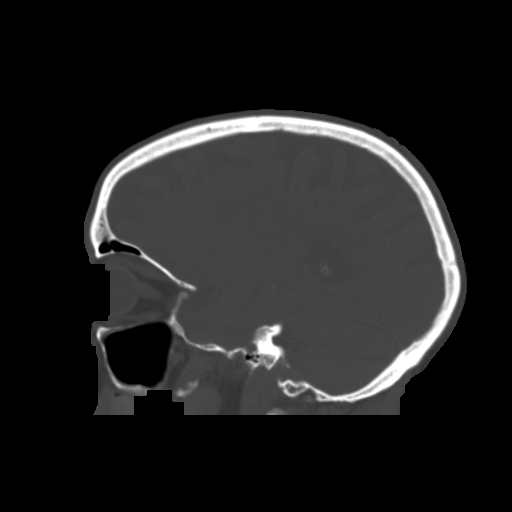

[16 of 33 positions shown; findings below may reference images not displayed]

FINDINGS: Brain: Mild diffuse atrophy is stable. There is no intracranial
mass, hemorrhage, extra-axial fluid collection, or midline shift.
Patchy small vessel disease in the centra semiovale bilaterally is
stable. There is a small lacunar infarct in the posterior left
lentiform nucleus. There is no new gray-white compartment lesion. No
acute infarct evident.

Vascular: No appreciable hyperdense vessel. There is calcification
in each carotid siphon region.

Skull: The bony calvarium appears intact.

Sinuses/Orbits: There is a small retention cyst in the lateral left
maxillary antrum. There is mild mucosal thickening in several
ethmoid air cells. Other paranasal sinuses are clear. Orbits appear
symmetric bilaterally.

Other: Mastoid air cells are clear.
IMPRESSION: Mild atrophy with patchy supratentorial small vessel disease. Prior
small lacunar infarct left posterior lentiform nucleus. No acute
infarct evident. No mass or hemorrhage.

There are foci of arterial vascular calcification. There are areas
of paranasal sinus disease.
